# Patient Record
Sex: Female | Born: 1945 | Race: White | Hispanic: No | Marital: Married | Smoking: Never smoker
Health system: Southern US, Community
[De-identification: ages and names within clinical notes are randomized; demographics above are authoritative.]

## PROBLEM LIST (undated history)

## (undated) DIAGNOSIS — J45909 Unspecified asthma, uncomplicated: Secondary | ICD-10-CM

## (undated) DIAGNOSIS — Z87442 Personal history of urinary calculi: Secondary | ICD-10-CM

## (undated) DIAGNOSIS — Z8709 Personal history of other diseases of the respiratory system: Secondary | ICD-10-CM

## (undated) DIAGNOSIS — N3946 Mixed incontinence: Secondary | ICD-10-CM

## (undated) DIAGNOSIS — M47812 Spondylosis without myelopathy or radiculopathy, cervical region: Secondary | ICD-10-CM

## (undated) DIAGNOSIS — G8929 Other chronic pain: Secondary | ICD-10-CM

## (undated) DIAGNOSIS — R159 Full incontinence of feces: Secondary | ICD-10-CM

## (undated) DIAGNOSIS — Z973 Presence of spectacles and contact lenses: Secondary | ICD-10-CM

## (undated) DIAGNOSIS — G40309 Generalized idiopathic epilepsy and epileptic syndromes, not intractable, without status epilepticus: Secondary | ICD-10-CM

## (undated) HISTORY — DX: Unspecified asthma, uncomplicated: J45.909

## (undated) HISTORY — PX: TOE SURGERY: SHX1073

## (undated) HISTORY — PX: NECK SURGERY: SHX720

## (undated) HISTORY — PX: ELBOW SURGERY: SHX618

## (undated) HISTORY — PX: DILATION AND CURETTAGE OF UTERUS: SHX78

---

## 1998-10-16 ENCOUNTER — Ambulatory Visit (HOSPITAL_COMMUNITY): Admission: RE | Admit: 1998-10-16 | Discharge: 1998-10-16 | Payer: Self-pay | Admitting: Gynecology

## 1999-08-15 ENCOUNTER — Ambulatory Visit (HOSPITAL_COMMUNITY): Admission: RE | Admit: 1999-08-15 | Discharge: 1999-08-15 | Payer: Self-pay | Admitting: Orthopedic Surgery

## 1999-10-29 ENCOUNTER — Inpatient Hospital Stay (HOSPITAL_COMMUNITY): Admission: RE | Admit: 1999-10-29 | Discharge: 1999-10-30 | Payer: Self-pay | Admitting: Orthopaedic Surgery

## 1999-12-12 ENCOUNTER — Encounter: Payer: Self-pay | Admitting: Emergency Medicine

## 1999-12-12 ENCOUNTER — Emergency Department (HOSPITAL_COMMUNITY): Admission: EM | Admit: 1999-12-12 | Discharge: 1999-12-13 | Payer: Self-pay | Admitting: Emergency Medicine

## 2000-01-13 ENCOUNTER — Other Ambulatory Visit: Admission: RE | Admit: 2000-01-13 | Discharge: 2000-01-13 | Payer: Self-pay | Admitting: Gynecology

## 2001-01-15 ENCOUNTER — Other Ambulatory Visit: Admission: RE | Admit: 2001-01-15 | Discharge: 2001-01-15 | Payer: Self-pay | Admitting: Gynecology

## 2001-09-12 ENCOUNTER — Inpatient Hospital Stay (HOSPITAL_COMMUNITY): Admission: RE | Admit: 2001-09-12 | Discharge: 2001-09-13 | Payer: Self-pay | Admitting: Orthopaedic Surgery

## 2002-01-16 ENCOUNTER — Other Ambulatory Visit: Admission: RE | Admit: 2002-01-16 | Discharge: 2002-01-16 | Payer: Self-pay | Admitting: Gynecology

## 2002-03-18 ENCOUNTER — Other Ambulatory Visit: Admission: RE | Admit: 2002-03-18 | Discharge: 2002-03-18 | Payer: Self-pay | Admitting: Gynecology

## 2002-09-09 ENCOUNTER — Other Ambulatory Visit: Admission: RE | Admit: 2002-09-09 | Discharge: 2002-09-09 | Payer: Self-pay | Admitting: Gynecology

## 2003-03-11 ENCOUNTER — Other Ambulatory Visit: Admission: RE | Admit: 2003-03-11 | Discharge: 2003-03-11 | Payer: Self-pay | Admitting: Gynecology

## 2004-04-12 ENCOUNTER — Other Ambulatory Visit: Admission: RE | Admit: 2004-04-12 | Discharge: 2004-04-12 | Payer: Self-pay | Admitting: Gynecology

## 2005-05-23 ENCOUNTER — Other Ambulatory Visit: Admission: RE | Admit: 2005-05-23 | Discharge: 2005-05-23 | Payer: Self-pay | Admitting: Gynecology

## 2006-06-05 ENCOUNTER — Other Ambulatory Visit: Admission: RE | Admit: 2006-06-05 | Discharge: 2006-06-05 | Payer: Self-pay | Admitting: Gynecology

## 2007-06-18 ENCOUNTER — Other Ambulatory Visit: Admission: RE | Admit: 2007-06-18 | Discharge: 2007-06-18 | Payer: Self-pay | Admitting: Gynecology

## 2007-06-25 ENCOUNTER — Ambulatory Visit (HOSPITAL_COMMUNITY): Admission: RE | Admit: 2007-06-25 | Discharge: 2007-06-26 | Payer: Self-pay | Admitting: Orthopaedic Surgery

## 2008-06-16 ENCOUNTER — Ambulatory Visit (HOSPITAL_COMMUNITY): Admission: RE | Admit: 2008-06-16 | Discharge: 2008-06-17 | Payer: Self-pay | Admitting: Orthopaedic Surgery

## 2009-06-11 ENCOUNTER — Encounter: Admission: RE | Admit: 2009-06-11 | Discharge: 2009-06-11 | Payer: Self-pay | Admitting: Orthopaedic Surgery

## 2009-07-22 ENCOUNTER — Inpatient Hospital Stay (HOSPITAL_COMMUNITY): Admission: RE | Admit: 2009-07-22 | Discharge: 2009-07-23 | Payer: Self-pay | Admitting: Orthopaedic Surgery

## 2010-12-22 ENCOUNTER — Other Ambulatory Visit: Payer: Self-pay | Admitting: Anesthesiology

## 2010-12-22 DIAGNOSIS — M47812 Spondylosis without myelopathy or radiculopathy, cervical region: Secondary | ICD-10-CM

## 2010-12-28 ENCOUNTER — Ambulatory Visit
Admission: RE | Admit: 2010-12-28 | Discharge: 2010-12-28 | Disposition: A | Discharge status: Home / Self Care | Payer: 59 | Source: Ambulatory Visit | Attending: Anesthesiology | Admitting: Anesthesiology

## 2010-12-28 DIAGNOSIS — M47812 Spondylosis without myelopathy or radiculopathy, cervical region: Secondary | ICD-10-CM

## 2010-12-28 MED ORDER — IOHEXOL 300 MG/ML  SOLN
100.0000 mL | Freq: Once | INTRAMUSCULAR | Status: AC | PRN
  Administered 2010-12-28: 100 mL via INTRAVENOUS

## 2010-12-30 LAB — COMPREHENSIVE METABOLIC PANEL
Albumin: 4.2 g/dL (ref 3.5–5.2)
Alkaline Phosphatase: 66 U/L (ref 39–117)
BUN: 16 mg/dL (ref 6–23)
GFR calc Af Amer: >60 mL/min (ref >60)
Sodium: 139 mEq/L (ref 135–145)
Total Protein: 6.5 g/dL (ref 6.0–8.3)

## 2010-12-30 LAB — DIFFERENTIAL
Eosinophils Absolute: 0.1 10*3/uL (ref 0.0–0.7)
Eosinophils Relative: 2 % (ref 0–5)
Lymphocytes Relative: 35 % (ref 12–46)
Lymphs Abs: 1.7 10*3/uL (ref 0.7–4.0)

## 2010-12-30 LAB — CBC
HCT: 41.3 % (ref 36.0–46.0)
Hemoglobin: 14.4 g/dL (ref 12.0–15.0)
MCHC: 34.8 g/dL (ref 30.0–36.0)
MCV: 89.6 fL (ref 78.0–100.0)
Platelets: 258 10*3/uL (ref 150–400)
RBC: 4.61 MIL/uL (ref 3.87–5.11)

## 2011-02-08 NOTE — Op Note (Signed)
NAMEMARG, Carol [name]            ACCOUNT NO.:  [id number]   MEDICAL RECORD NO.:  [id number]          PATIENT TYPE:  AMB   LOCATION:  SDS                          FACILITY:  [facility]   PHYSICIAN:  Mark C. Ophelia Charter, M.D.    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   OPERATION/PROCEDURE:  C3-C4, C4-C5 anterior cervical diskectomy and  fusion, allograft and plating.  The external bone stimulator at  application.   SURGEON:  Mark C. Ophelia Charter, M.D.   ANESTHESIA:  General orotracheal.   ESTIMATED BLOOD LOSS:  Less than 100 mL.   DRAINS:  One Hemovac.   36 mm Biomet view lock plate,  16-XW locking screws.   DESCRIPTION OF PROCEDURE:  After induction of general anesthesia  orotracheal intubation and halter traction without weight, standard  prepping with DuraPrep and a yellow foam sponge placed underneath the  shoulder blades.  The neck was prepped with DuraPrep.  The area squared  with towels, Betadine and Vi-Drape applied after sterile skin marker on  a prominent skin fold.  The patient had a previous Cloward C5-6, C6-7  fusion which had healed solid.  And subperiosteal dissection on the  between the longus coli with carotid sheath and contents to the left  with left transverse incision starting at the midline.  Platysma was  split in line with the fibers and 25 short needle was placed at the 4-5  level.  Cross-table lateral C-arm confirmed this was appropriate level.  Since this was the level of the disk was some cord mild compression, 4-5  level was done first.  C4 level had primarily spondylitic hard and soft  disk combination.  Diskectomy was performed with the 15-blade.  Cloward  curets were used so  scraping out the gutters.  Operative microscope was  draped and brought in posterior longitudinal ligament was taking down  extruded disk fragment were removed and overhanging spurs.  Gutters were  stripped.  Sizing showed that a 7-mm graft was appropriate for this  level.  After take-down of posterior longitudinal ligament, there was  some epidural bleeding which was controlled with FloSeal and a padding.  Field was dry and a initial graft was placed 7 mm.  The upper level 6-mm  graft was appropriate and at this level there was primarily spurs with  overhanging from C3 over the disk space.  This had to be thinned down  with a 4-mm bur after endplates were curetted and then taken down with  microdissection with 1 mm Kerrison and Black nerve hook.  All bone was  removed.  Dura was visualized.  The vertebral joints were stripped and a  6-mm graft was placed.  Appropriate length plate was selected, checked  under fluoro, AP and lateral, with single prong plate holder spikes  holding the plate.  It was adjusted so that it was at  the midline  without angulation and then all six holes were hand drilled, screws  placed, 14 mm self tapping, and twisted down to the locked into the  ring.  Final AP and lateral x-rays were taken.  Good position of grafts,  plate, screws.  Operative field was dry.  Hemovac was placed through separate stab  incision with in-and-out technique of 3-0 Vicryl.  In the platysma, 4-0  Vicryl subcuticular.  The skin was then dermabonded and postop dressing  was applied with soft cervical collar.  The patient was neurologically  intact.      Mark C. Ophelia Charter, M.D.  Electronically Signed     MCY/MEDQ  D:  06/25/2007  T:  06/26/2007  Job:  [id number]

## 2011-02-08 NOTE — Op Note (Signed)
[name], Carol            ACCOUNT NO.:  [id number]   MEDICAL RECORD NO.:  [id number]          PATIENT TYPE:  OIB   LOCATION:  5010                         FACILITY:  [facility]   PHYSICIAN:  Mark C. Ophelia Charter, M.D.    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   PROCEDURE:  Posterior C4-5 fusion with wiring and Vitoss with iliac  crest aspirate.   SURGEON:  Mark C. Ophelia Charter, MD   ASSISTANT:  Wende Neighbors, PA-C   ANESTHESIA:  GOT plus Marcaine local.   DRAINS:  None.   A 65 year old female status post multiple cervical procedures and  pseudoarthrosis at C4-5 with persistent pain.  There was motion on  flexion/extension x-rays and lucency around the bone graft.   After induction of general anesthesia and orotracheal intubation, the  patient was placed prone on horseshoe head holder.  The neck was prepped  and squared with towels, arms tucked to the side with Gelfoam padding,  positioning prone position.  After a sterile skin marker, Betadine and  Biodrape application and thyroid sheet surgical checklist time-out was  completed, and preoperative Ancef was given prophylactically.  Old  incision was used.  Subcutaneous dissection down subperiosteally onto  the lamina was performed.  Expected level C4-5 was noted with bone at  the C5-6 level posteriorly and motion at C4-5 level.  A Kocher clamp was  placed through C4 and C5, and radiograph was taken which confirmed  appropriate level with a Kocher clamp between C4 and C5.  Decortication  over the lamina was performed.  A 24-gauge wire was twisted into strand,  passed around as it was being tightened down it would slip off the top  of C4 due to slope.  A small notch was made at the base of C4 with a  Matt Holmes rongeur.  A portion of the slippage was due to the new bone  formation along the lamina and  upsweep of the spinous process of C5 from  the patient's previous posterior cervical fusion, a more caudad level.  As the wires are being retightened down spinous process cracked at C4.  Wire was cut, removed and a 22-gauge wire was then selected and some  holes were drilled in the base of the spinous process and lamina.  Wire  was passed through each hole which was unicortical on the lamina at the  base of the spinous process several rounds underneath C5 and then  twisted down.  This held securely and Vitoss was then prepared using  left posterior iliac crest aspirate through a stab incision.  Vitoss was  split evenly into two areas which was a 10 mL strip and packed down onto  the decorticated lamina.  Operative field was dry.  The deep layer  closed with 0 Vicryl, 2-0 Vicryl subcutaneous tissue, 4-0 Vicryl  subcuticular closure, postop dressing, soft cervical collar.  Instrument  count and needle count was correct.      Mark C. Ophelia Charter, M.D.  Electronically  Signed     MCY/MEDQ  D:  06/17/2008  T:  06/17/2008  Job:  [id number]

## 2011-02-11 NOTE — Op Note (Signed)
[place]  Patient:    Carol [name]                    MRN: [id number] Proc. Date: 10/29/99 Adm. Date:  [id number] Disc. Date: [id number] Attending:  Jacki Cones                           Operative Report  PREOPERATIVE DIAGNOSIS:  Painful pseudarthrosis.  POSTOPERATIVE DIAGNOSIS:  Painful pseudarthrosis.  OPERATION PERFORMED:  C6-7 takedown pseudoarthrosis and Cloward fusion, right iliac crest bone graft.  SURGEON:  Mark C. Ophelia Charter, M.D.  ANESTHESIA:  General.  ESTIMATED BLOOD LOSS:  200 cc.  DRAINS:  One Hemovac in neck.  DESCRIPTION OF PROCEDURE:  This patient had a previous fusion by Dr. Kathrene Bongo about one year ago and has had neck and right arm pain for several months. Repeat MRI showes a pseudarthrosis at C6-7 and flexion extension x-ray showed motion at C6-7.  C5-6 had healed completely.  Standard Dura-Prep, preoperative Ancef was used.  Both areas were squared out with towels, the right iliac crest and neck and a 2-pound sandbag behind the neck and gelbag behind the right buttock.  Betadine Vi-Drapes were applied after the old  skin was marked with a skin marker.  Sterile Mayo stand at the head, thyroid sheets and drapes.  Old incision was opened and scar tissue was gently lysed. Platysma elevated.  Sternocleidomastoid retracted laterally.  Omohyoid was gently retracted, not divided and scar tissue was divided directly over the anterior spur between the longus coli.  After I removed some of the spur with a rongeur, a 25 needle was stuck into the  pseudarthrosis site and a crosstable lateral x-ray confirmed it was at the C6-7  level.  Using a 12 mm Cloward drill, sequentially, the hole was drilled through the hard sclerotic bone.  The pseudarthrosis followed an irregular line as expected and as the sequential drilling progressed using Carlens curets and Cloward curets, y the time the posterior cortex was  encountered, the pseudarthrosis line was directly in the middle of the drill hole.  Operative microscope was prepped with a sterile drape and brought in and posterior longitudinal ligament was taken down on the right side.  Spurs were removed.  There was some soft tissue and fibrous tissue in the gutter but no residual disk material was present.  Gutters were meticulously stripped of soft tissue and fibrous tissue to give good bleeding bone present.  This was repeated using Carlens and Cloward curets from the opposite site as well, both right and left gutters.  There was a little fibrous pocket in the Cloward drill hole at the 4 to 5 oclock position deep in the posterior aspect of the C7  body.  After irrigation with saline solution and reinspection for any remaining  fibrous tissue.  The 14 mm plug was harvested from the right iliac crest by splitting the fascia in line with its fibers and harvesting the 14 plug.  There was a good cortical plug exactly perpendicular to the bicortical surface.  Depth gauge was used.  Plug was inserted, countersunk 2 to 3 mm with an extremely tight fit. Due to the excellent fit and minimal motion at the pseudarthrosis site, it was elected not to cortically plate the structure.  After irrigation with saline solution, a Hemovac drain was placed through a separate stab incision. Platysma was closed followed by skin closure with  4-0 Vicryl suture.  The iliac crest was closed with 0 Vicryl in the fascia, 2-0 in the subcutaneous tissue, 3-0 Vicryl subcutaneous and 4-0 Vicryl subcuticular.  Dressings were applied to both incisions, soft collar and the patient was transferred to the recovery room in stable condition.  Instrument count and needle count was correct. DD:  10/29/99 TD:  10/30/99 Job: [id number]

## 2011-02-11 NOTE — Op Note (Signed)
. [facility]  Patient:    Carol [name], Carol [name] Visit Number: [id number] MRN: [id number]          Service Type: SUR Location: [phone] Attending Physician:  Jacki Cones Dictated by:   Veverly Fells Ophelia Charter, M.D. Proc. 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  PROCEDURE:  Posterior C6-7 fusion, interspinous wiring with right posterior iliac crest bone graft.  SURGEON:  Mark C. Ophelia Charter, M.D.  FIRST ASSISTANT:  Colon Flattery. Ollen Bowl, M.D.  SECOND ASSISTANT:  Zonia Kief, P.A.-C.  ANESTHESIA:  GOT.  ESTIMATED BLOOD LOSS:  400 ml.  DESCRIPTION OF PROCEDURE:  After the induction of general anesthesia and orotracheal intubation, the patient was placed in a prone position with careful positioning by myself and Dr. Ollen Bowl.  Arms were tucked at the sides, the head was placed in the horseshoe head holder with excessive Webril padding.  Appropriate neutral position of the neck was maintained.  _____ drapes were placed with tincture of benzoin.  The area was prepped with Duraprep, preoperative Ancef was given prophylactically.  A sterile skin marker was used on the neck prior to Betadine Vi-Drape application, marking the C7 spinous process in the midline.  Sterile Mayo stand was placed at the head, thyroid sheet and the usual half-sheet in between.  The incision was made starting in the midline, extending from C4 to C7, the tip of the spinous processes.  Subperiosteal dissection down onto the lamina was performed. Self-retaining retractors were placed using Gelpis, long Gelpis, and a cerebellar retractor.  The bed was placed in reverse Trendelenburg to aid in visualization.  Small bleeders were controlled with the Bovie electrocautery.  A Kocher clamp was placed on C7, and there was trace motion between  C6-7. C5-6 was solid with no motion with the lamina, and the spinous process was fully exposed.  There was gapping of the interspinous space and interlaminar space at C6-7 consistent with pseudoarthrosis.  A bone bur was used for partial decortication, enough to just get the bone surface bleeding. Irrigation was used intermittently throughout the case.  Sponges were placed, and the right iliac crest was exposed, taking corticocancellous strips followed by abundant cancellous graft using a large curette.  This was cut into small strips, divided into pieces, half and half for each side. Corticocancellous strips were initially placed vertically from cephalad to caudad across the lamina.  Pieces of cancellous bone, small, were placed between the inferior aspect of the lamina of C6 and top of C7 without protruding into the canal.  Small pieces were placed underneath the interspinous ligament, which was saved.  Abundant chunks of cancellous bone were placed over the top of the corticocancellous strips.  Twenty-four gauge wire was then braided using the drill into a twisting fashion, making a cable. Once it was smoothly twisted, the tip of it was bent and it was passed using a right angle clamp underneath the base of C7 and then around the top of a small notch placed at C6.  The C6 spinous process was adequate in size and allowed good securement of the wire.  The wire was twisted on the right side, cut, and then bent.  There was no prominence of the wire, and bone graft  was meticulously placed from C6-7 and after removal of the self-retaining retractors with care taken not to move the bone graft, the entire incision was irrigated and then sequentially closed with 0 Vicryl in the deep fascia, 0 Vicryl in the superficial fascia, 2-0 Vicryl in the subcutaneous tissue, and a 4-0 Vicryl subcuticular skin closure.  Tincture of benzoin and Steri-Strips, Marcaine infiltration was then placed.  Hemovac was  placed in the neck prior to closure, exiting through a stab incision made with an in-out technique in line with the skin incision inferiorly.  A second Hemovac drain was placed in the hip, and the iliac crest was closed with 0 Vicryl in the fascia, 2-0 Vicryl in the subcutaneous tissue, Marcaine infiltration, skin staple closure, and a postop dressing.  Instrument count and needle count were correct.  The patient was transferred to the recovery room in stable condition. Dictated by:   Veverly Fells Ophelia Charter, M.D. Attending Physician:  Jacki Cones DD:  09/12/01 TD:  09/13/01 Job: [id number]

## 2011-06-27 LAB — CBC
MCV: 88.4
RBC: 4.74
RDW: 11.8
WBC: 5.2

## 2011-06-27 LAB — COMPREHENSIVE METABOLIC PANEL
ALT: 21
AST: 22
Alkaline Phosphatase: 57
BUN: 15
CO2: 27
Calcium: 9.8
GFR calc Af Amer: >60
Potassium: 4.7
Total Protein: 6.2

## 2011-06-27 LAB — DIFFERENTIAL
Basophils Absolute: 0
Eosinophils Relative: 2
Lymphs Abs: 1.7
Neutro Abs: 3
Neutrophils Relative %: 58

## 2011-06-27 LAB — APTT: aPTT: 30

## 2011-07-07 LAB — BASIC METABOLIC PANEL
BUN: 17
Calcium: 10.1
Creatinine, Ser: 1.03
GFR calc Af Amer: >60
Potassium: 5

## 2011-07-07 LAB — DIFFERENTIAL
Basophils Absolute: 0
Basophils Relative: 1
Eosinophils Absolute: 0.1
Eosinophils Relative: 2
Lymphs Abs: 1.7
Neutrophils Relative %: 56

## 2011-07-07 LAB — CBC
HCT: 44.5
Hemoglobin: 15.5 — ABNORMAL HIGH
MCHC: 34.7
MCV: 86.9
Platelets: 297
RBC: 5.12 — ABNORMAL HIGH
RDW: 11.8
WBC: 5

## 2011-07-07 LAB — URINE MICROSCOPIC-ADD ON

## 2011-07-07 LAB — URINALYSIS, ROUTINE W REFLEX MICROSCOPIC
Hgb urine dipstick: NEGATIVE
Protein, ur: NEGATIVE
Specific Gravity, Urine: 1.02

## 2011-07-07 LAB — APTT: aPTT: 31

## 2011-08-08 IMAGING — CT CT CERVICAL SPINE W/O CM
3 of 4 series · 16 of 28 positions shown, 18 images · non-contrast
Comparison: Intraoperative film 06/16/2008.

CLINICAL DATA: Status post cervical spine fusion at C4-5.
Persistent right shoulder pain.  Question pseudarthrosis.

CT CERVICAL SPINE WITHOUT CONTRAST
TECHNIQUE: Multidetector CT imaging of the cervical spine was
performed. Multiplanar CT image reconstructions were also
generated.

[Series 2: cervical spine · axial · 0.23mm/px · z∈[+95,+211]mm · 5 of 139 slices shown, 7 images]
[im 24/139  soft-tissue]
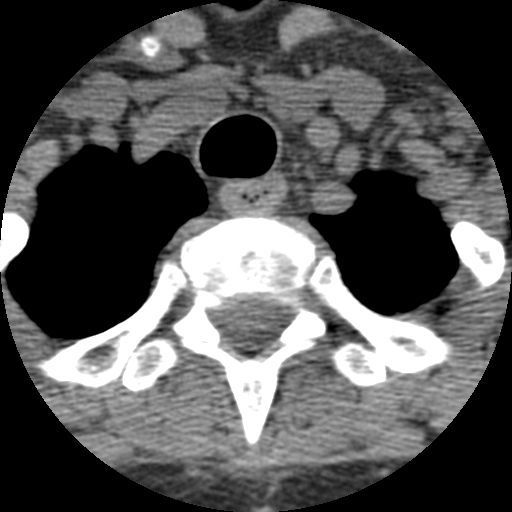
[im 24/139  bone]
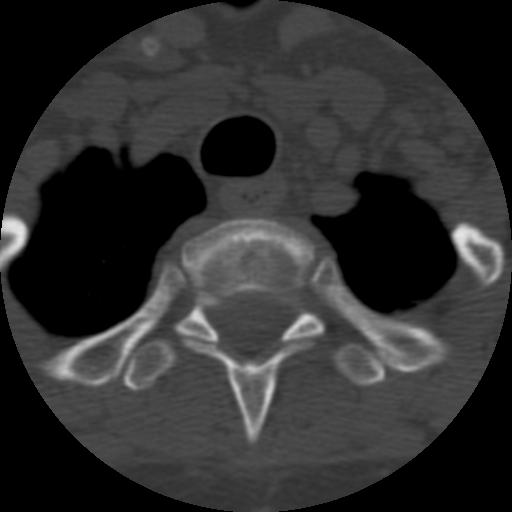
[im 47/139  bone]
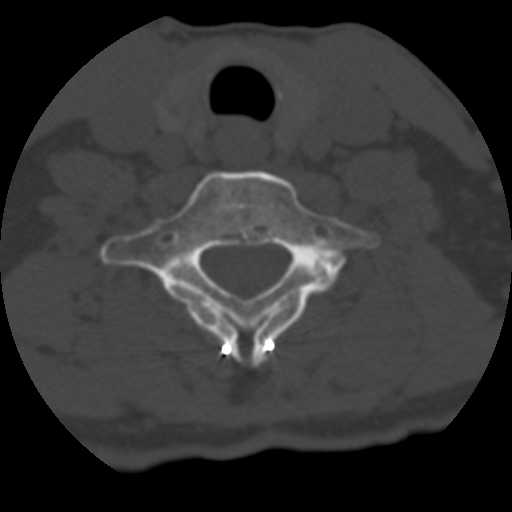
[im 70/139  bone]
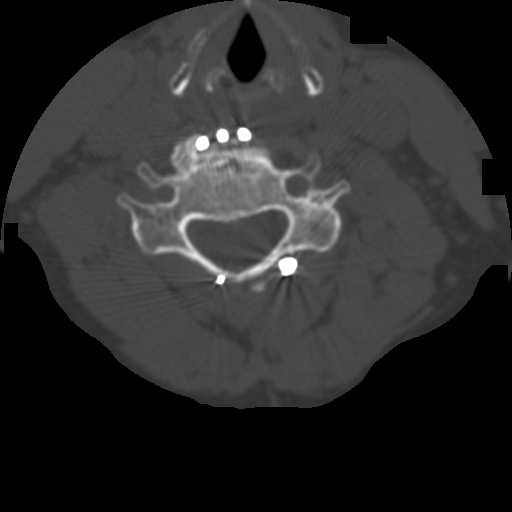
[im 93/139  bone]
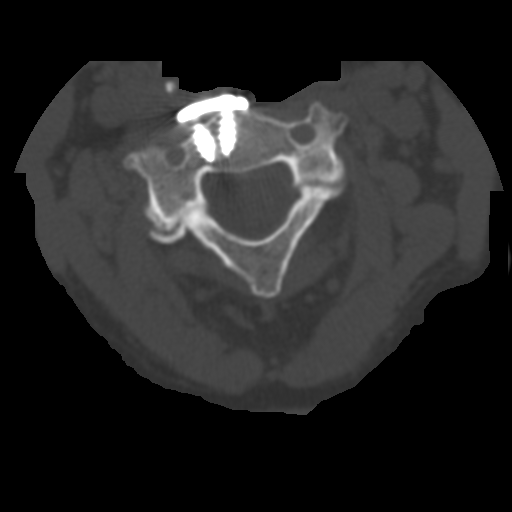
[im 116/139  soft-tissue]
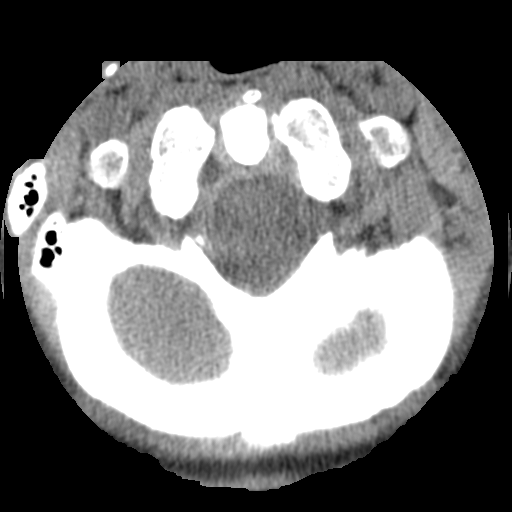
[im 116/139  bone]
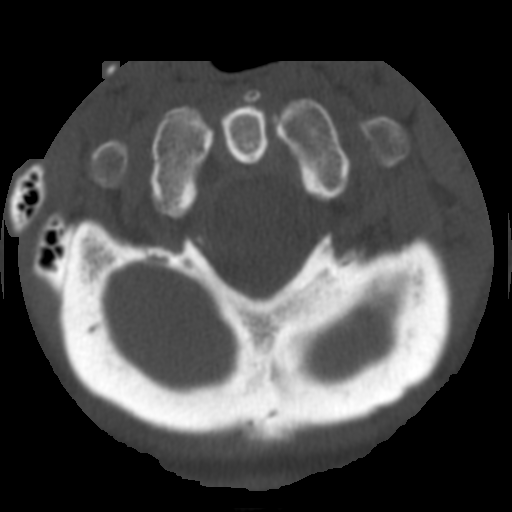

[Series 3: bone windows · axial · 0.23mm/px · z∈[+95,+210]mm · 5 of 140 slices shown]
[im 24/140  bone]
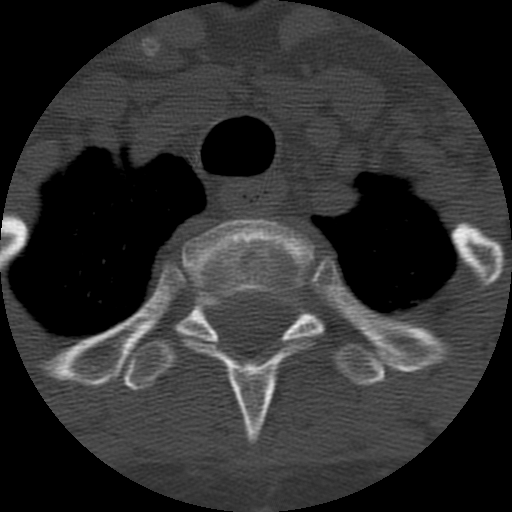
[im 47/140  bone]
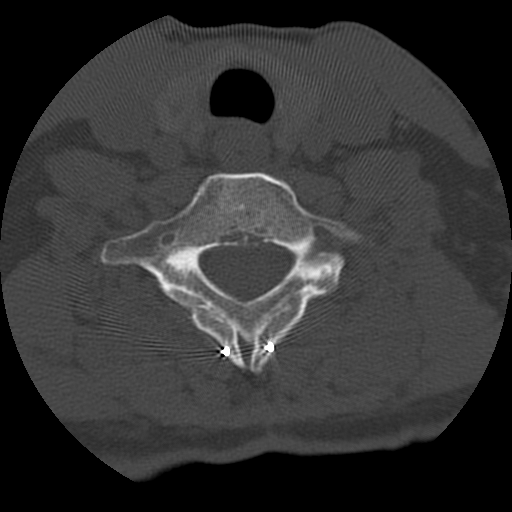
[im 70/140  bone]
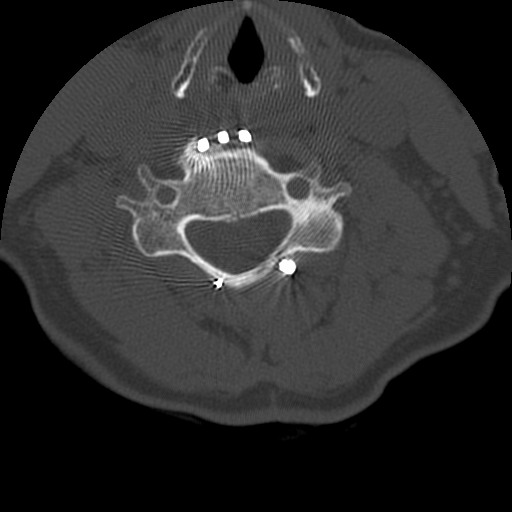
[im 93/140  bone]
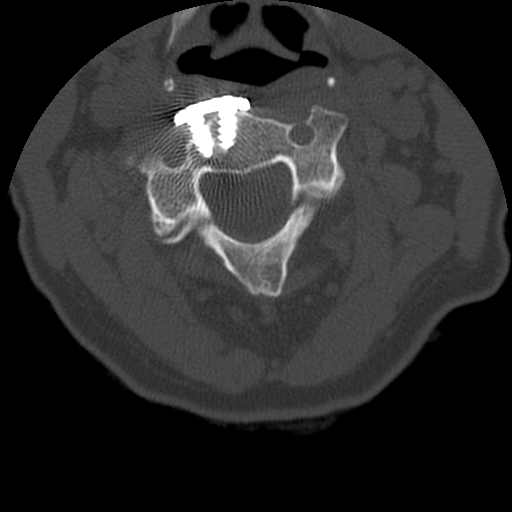
[im 116/140  bone]
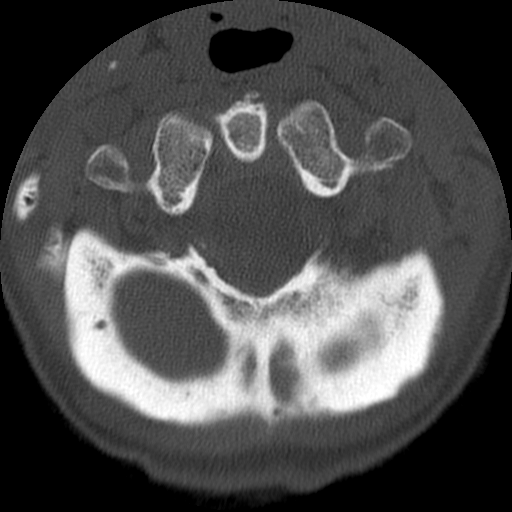

[Series 400: coronal · coronal · 0.35mm/px · 6 of 40 slices shown]
[im 5/40  soft-tissue]
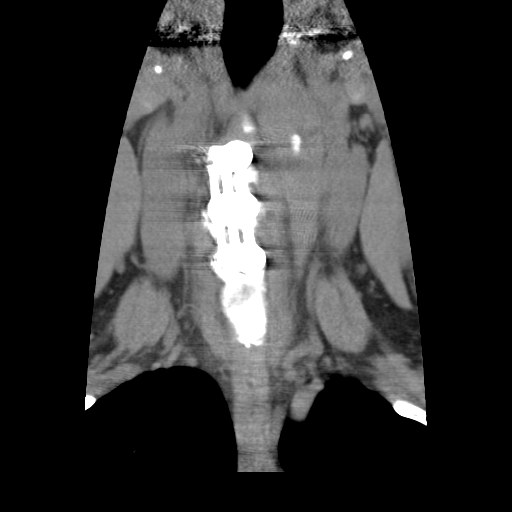
[im 7/40  bone]
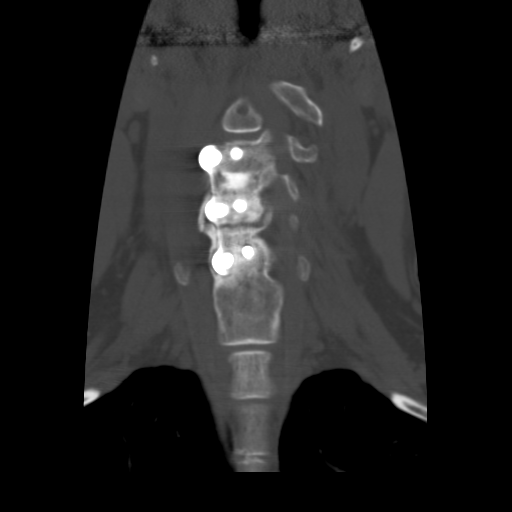
[im 14/40  bone]
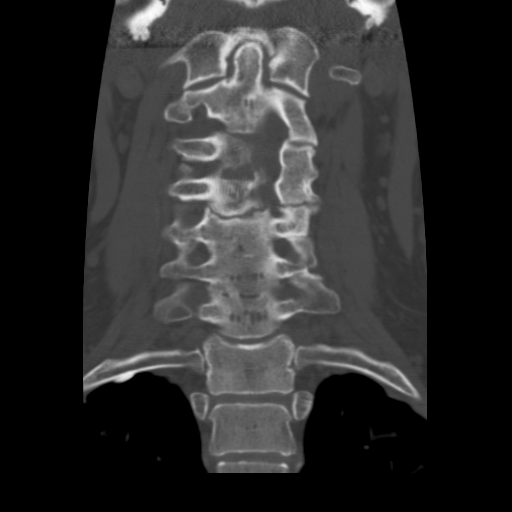
[im 20/40  bone]
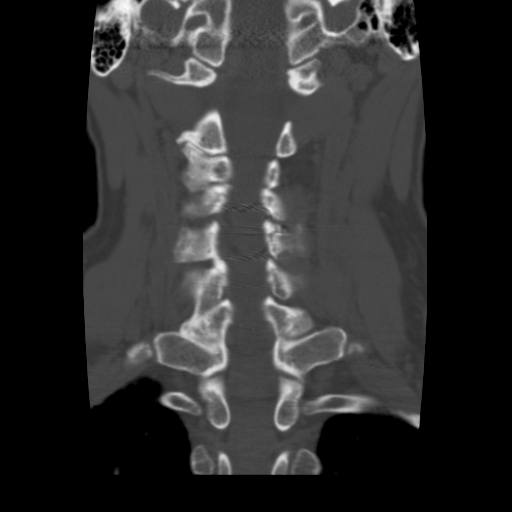
[im 27/40  bone]
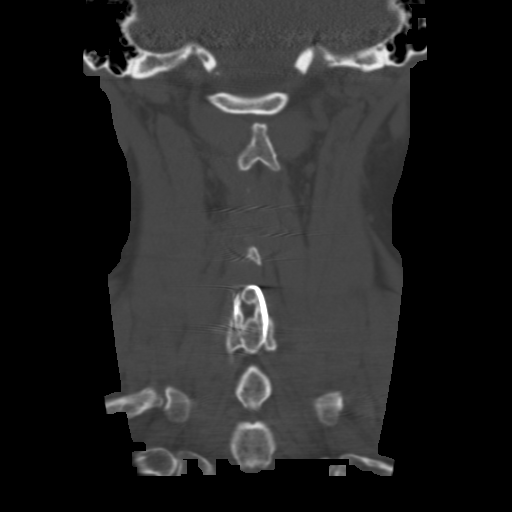
[im 33/40  bone]
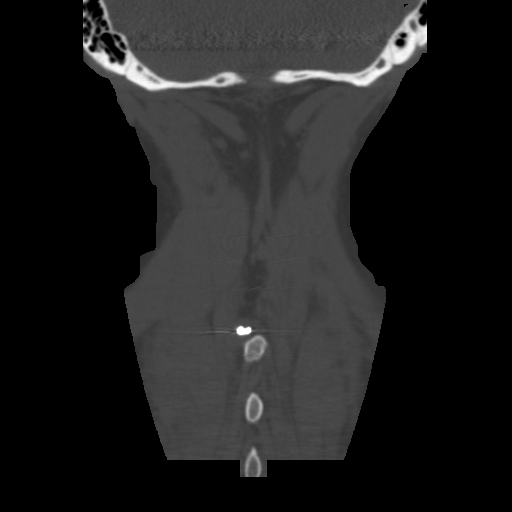

[16 of 28 positions shown; findings below may reference images not displayed]

FINDINGS: There is mature osseous fusion at C5-6 and C6-7.
Anterior plate screw fixation is present at C3-4 and C4-5.  There
is bridging bone at the C3-4 level.  There is lucency surrounding
the disc space air at the C4-5.  There is incomplete column of bone
within the spacer.  The left inferior screw at C5 is fractured.
There is some lucency about the proximal aspect of the screw and
the anterior plate.  The right-sided screw appears well seated.
There is slight anterolisthesis at C4-5.  Alignment is otherwise
anatomic.  Posterior cerclage wires are present at C4-5.  There is
also a posterior cerclage wire at C6-7 with mature osseous fusion
of the spinous processes.  Individual disc levels are as follows.

C2-3:  Asymmetric right-sided facet hypertrophy is present.  There
is no significant stenosis.

C3-4:  Mild uncovertebral disease is present.  There is asymmetric
left-sided facet hypertrophy.  Mild left foraminal narrowing is
noted.

C4-5:  There is uncovertebral disease bilaterally.  Asymmetric left-
sided facet hypertrophy is noted.  There is mild left foraminal
narrowing.  As stated, there is persistent lucency around the disc
space without clear bridging bone.

C5-6:  The patient is fused at this level.  There is no focal
stenosis.

C6-7:  The patient is fused.  There is mild osseous foraminal
narrowing bilaterally.  The central canal appears patent.

C7-C1:  Mild facet hypertrophy is present on the right.  There is
no significant stenosis.
IMPRESSION: 1.  There is only a single column of attempted bridging bone inside
the spacer at C4-5.  There is lucency through this column.  There
is persistent lucency surrounding the disc space are otherwise ,
suggesting nonunion.
2.  There is a fracture of the left C5 screw with some lucency
surrounding the proximal portion of the screw and the anterior
plate at the C5 level on the left.

3.  Mature osseous fusion at C5-6 and C6-7.
4.  Bridging bone and osseous fusion at C3-4.
5.  Mild residual spondylosis as described.

## 2012-10-15 ENCOUNTER — Other Ambulatory Visit: Payer: Self-pay | Admitting: Gynecology

## 2012-10-15 DIAGNOSIS — M949 Disorder of cartilage, unspecified: Secondary | ICD-10-CM

## 2012-10-15 DIAGNOSIS — Z1231 Encounter for screening mammogram for malignant neoplasm of breast: Secondary | ICD-10-CM

## 2012-10-15 DIAGNOSIS — M899 Disorder of bone, unspecified: Secondary | ICD-10-CM

## 2012-11-12 ENCOUNTER — Ambulatory Visit
Admission: RE | Admit: 2012-11-12 | Discharge: 2012-11-12 | Disposition: A | Discharge status: Home / Self Care | Payer: BC Managed Care – PPO | Source: Ambulatory Visit | Attending: Gynecology | Admitting: Gynecology

## 2012-11-12 DIAGNOSIS — Z1231 Encounter for screening mammogram for malignant neoplasm of breast: Secondary | ICD-10-CM

## 2012-11-12 DIAGNOSIS — M899 Disorder of bone, unspecified: Secondary | ICD-10-CM

## 2014-03-17 LAB — HM COLONOSCOPY

## 2017-04-20 LAB — VITAMIN D 25 HYDROXY (VIT D DEFICIENCY, FRACTURES): VIT D 25 HYDROXY: 27.5

## 2017-04-20 LAB — LIPID PANEL
CHOLESTEROL: 176 (ref 0–200)
HDL: 61 (ref 35–70)
LDL CALC: 97
TRIGLYCERIDES: 88 (ref 40–160)

## 2017-04-20 LAB — BASIC METABOLIC PANEL
BUN: 16 (ref 4–21)
Creatinine: 1.1 (ref 0.5–1.1)

## 2017-04-20 LAB — TSH: TSH: 3.78 (ref 0.41–5.90)

## 2017-05-16 ENCOUNTER — Encounter: Payer: Self-pay | Admitting: Physician Assistant

## 2017-05-16 ENCOUNTER — Ambulatory Visit (INDEPENDENT_AMBULATORY_CARE_PROVIDER_SITE_OTHER): Payer: BLUE CROSS/BLUE SHIELD | Admitting: Physician Assistant

## 2017-05-16 VITALS — BP 138/70 | HR 98 | Temp 98.3°F | Ht 62.0 in | Wt 152.0 lb

## 2017-05-16 DIAGNOSIS — R32 Unspecified urinary incontinence: Secondary | ICD-10-CM | POA: Diagnosis not present

## 2017-05-16 DIAGNOSIS — R790 Abnormal level of blood mineral: Secondary | ICD-10-CM

## 2017-05-16 DIAGNOSIS — Z23 Encounter for immunization: Secondary | ICD-10-CM

## 2017-05-16 DIAGNOSIS — R079 Chest pain, unspecified: Secondary | ICD-10-CM | POA: Diagnosis not present

## 2017-05-16 DIAGNOSIS — R197 Diarrhea, unspecified: Secondary | ICD-10-CM

## 2017-05-16 LAB — CBC WITH DIFFERENTIAL/PLATELET
BASOS ABS: 0 10*3/uL (ref 0.0–0.1)
Basophils Relative: 0.6 % (ref 0.0–3.0)
EOS ABS: 0.1 10*3/uL (ref 0.0–0.7)
Eosinophils Relative: 1.4 % (ref 0.0–5.0)
HEMATOCRIT: 44.9 % (ref 36.0–46.0)
HEMOGLOBIN: 15.3 g/dL — AB (ref 12.0–15.0)
LYMPHS PCT: 31.3 % (ref 12.0–46.0)
Lymphs Abs: 2.1 10*3/uL (ref 0.7–4.0)
MCHC: 34 g/dL (ref 30.0–36.0)
MCV: 89.7 fl (ref 78.0–100.0)
MONO ABS: 0.4 10*3/uL (ref 0.1–1.0)
Monocytes Relative: 6.5 % (ref 3.0–12.0)
Neutro Abs: 4 10*3/uL (ref 1.4–7.7)
Neutrophils Relative %: 60.2 % (ref 43.0–77.0)
Platelets: 311 10*3/uL (ref 150.0–400.0)
RBC: 5.01 Mil/uL (ref 3.87–5.11)
RDW: 13 % (ref 11.5–15.5)
WBC: 6.7 10*3/uL (ref 4.0–10.5)

## 2017-05-16 LAB — IBC PANEL
Iron: 88 ug/dL (ref 42–145)
SATURATION RATIOS: 19.5 % — AB (ref 20.0–50.0)
TRANSFERRIN: 322 mg/dL (ref 212.0–360.0)

## 2017-05-16 LAB — TSH: TSH: 2.01 u[IU]/mL (ref 0.35–4.50)

## 2017-05-16 LAB — COMPREHENSIVE METABOLIC PANEL
ALT: 20 U/L (ref 0–35)
AST: 18 U/L (ref 0–37)
Albumin: 4.2 g/dL (ref 3.5–5.2)
Alkaline Phosphatase: 79 U/L (ref 39–117)
BUN: 19 mg/dL (ref 6–23)
CALCIUM: 9.6 mg/dL (ref 8.4–10.5)
CHLORIDE: 106 meq/L (ref 96–112)
CO2: 31 meq/L (ref 19–32)
CREATININE: 0.97 mg/dL (ref 0.40–1.20)
GFR: 60.51 mL/min (ref >60.00)
Glucose, Bld: 97 mg/dL (ref 70–99)
POTASSIUM: 4.2 meq/L (ref 3.5–5.1)
Sodium: 142 mEq/L (ref 135–145)
Total Bilirubin: 0.4 mg/dL (ref 0.2–1.2)
Total Protein: 7.4 g/dL (ref 6.0–8.3)

## 2017-05-16 LAB — FERRITIN: Ferritin: 50 ng/mL (ref 10.0–291.0)

## 2017-05-16 NOTE — Patient Instructions (Addendum)
It was great to see you!  For your diarrhea -- please work on increasing the fiber in your diet and staying hydrated. I would like for you to start a probiotic, this can be purchased over the counter. Consider Align or Digestive Advantage. Please return stool samples. If symptoms worsen in the meantime, please let us know.  For your chest pain -- I have referred you to cardiology. They can evaluate your risk factors and determine if you need further work-up. You will be contacted about this. If your pain returns and is atypical of your prior events --> please go to the ER.  We will contact you with your lab results.  Let's follow-up at your convenience, within a month to talk about your diarrhea and urinary incontinence, sooner if needed.   High-Fiber Diet Fiber, also called dietary fiber, is a type of carbohydrate found in fruits, vegetables, whole grains, and beans. A high-fiber diet can have many health benefits. Your health care provider may recommend a high-fiber diet to help:  Prevent constipation. Fiber can make your bowel movements more regular.  Lower your cholesterol.  Relieve hemorrhoids, uncomplicated diverticulosis, or irritable bowel syndrome.  Prevent overeating as part of a weight-loss plan.  Prevent heart disease, type 2 diabetes, and certain cancers.  What is my plan? The recommended daily intake of fiber includes:  38 grams for men under age 57.  72 grams for men over age 66.  97 grams for women under age 74.  73 grams for women over age 107.  You can get the recommended daily intake of dietary fiber by eating a variety of fruits, vegetables, grains, and beans. Your health care provider may also recommend a fiber supplement if it is not possible to get enough fiber through your diet. What do I need to know about a high-fiber diet?  Fiber supplements have not been widely studied for their effectiveness, so it is better to get fiber through food  sources.  Always check the fiber content on thenutrition facts label of any prepackaged food. Look for foods that contain at least 5 grams of fiber per serving.  Ask your dietitian if you have questions about specific foods that are related to your condition, especially if those foods are not listed in the following section.  Increase your daily fiber consumption gradually. Increasing your intake of dietary fiber too quickly may cause bloating, cramping, or gas.  Drink plenty of water. Water helps you to digest fiber. What foods can I eat? Grains Whole-grain breads. Multigrain cereal. Oats and oatmeal. Brown rice. Barley. Bulgur wheat. [place]. Bran muffins. Popcorn. Rye wafer crackers. Vegetables Sweet potatoes. Spinach. Kale. Artichokes. Cabbage. Broccoli. Green peas. Carrots. Squash. Fruits Berries. Pears. Apples. Oranges. Avocados. Prunes and raisins. Dried figs. Meats and Other Protein Sources [place], kidney, pinto, and soy beans. Split peas. Lentils. Nuts and seeds. Dairy Fiber-fortified yogurt. Beverages Fiber-fortified soy milk. Fiber-fortified orange juice. Other Fiber bars. The items listed above may not be a complete list of recommended foods or beverages. Contact your dietitian for more options. What foods are not recommended? Grains White bread. Pasta made with refined flour. White rice. Vegetables Fried potatoes. Canned vegetables. Well-cooked vegetables. Fruits Fruit juice. Cooked, strained fruit. Meats and Other Protein Sources Fatty cuts of meat. Fried Sales executive or fried fish. Dairy Milk. Yogurt. Cream cheese. Sour cream. Beverages Soft drinks. Other Cakes and pastries. Butter and oils. The items listed above may not be a complete list of foods and beverages to avoid. Contact  your dietitian for more information. What are some tips for including high-fiber foods in my diet?  Eat a wide variety of high-fiber foods.  Make sure that half of all grains consumed  each day are whole grains.  Replace breads and cereals made from refined flour or white flour with whole-grain breads and cereals.  Replace white rice with brown rice, bulgur wheat, or millet.  Start the day with a breakfast that is high in fiber, such as a cereal that contains at least 5 grams of fiber per serving.  Use beans in place of meat in soups, salads, or pasta.  Eat high-fiber snacks, such as berries, raw vegetables, nuts, or popcorn. This information is not intended to replace advice given to you by your health care provider. Make sure you discuss any questions you have with your health care provider. Document Released: 09/12/2005 Document Revised: 02/18/2016 Document Reviewed: 02/25/2014 Elsevier Interactive Patient Education  2017 [employer].

## 2017-05-16 NOTE — Progress Notes (Signed)
Carol [name] is a 71 y.o. female here for a new problem.  History of Present Illness:   Chief Complaint  Patient presents with  . Establish Care  . Labs Only  . Chest Pain  . Diarrhea    X46months     Acute Concerns: Chest pain -- mid-sternal chest pain x 4 times over the past 2 months. Radiates to bilateral jaw. Lasts about 5-10 min. Happens at rest.  "Hurts like heck" -- unable to tell me if its sharp, shooting, dull. Pressing the area does not reproduce the pain. No hx of heartburn or reflux. Started 2 months ago. Unable to recall if worsened with deep inspiration. No SOB, changes in vision, or unusual HA, no swelling in lower legs. No exertional symptoms. Last occurred 1-2 weeks ago. Does not consider herself an anxious person. Very little caffeine. No prior hx of cardiology issues.  Diarrhea -- she has been experiencing this for 6 months. Normally goes to the bathroom every 2-3 days. Now is having loose stools, will have 3-4 in a day.. No dietary changes in the past 6 months. Back in Feb went to [place] (for a total of 9 days) and since that trip has had intermittent loose stools, did have flu-like symptoms while on her trip. 4 years ago had a colonoscopy and was cleared for 10 years to return. No family hx of IBD, IBS, colon cancer. No changes in weight.  No bloody stools. No hemorrhoids. Has been drinking plenty of water. Eats wheat bread, vegetables. No fiber supplements or probiotics. Limited fruit in diet.   Incontinence -- has urinary urgency over the last 6-9 months. Has to wear a pad or panty liner. Has had 2 vaginal births. Has not had prior work up for this before.  Abnormal labs -- at the end of July she got some labs done for her husband's insurance, shows elevated Cre of 1.08, elevated iron of 165 and decreased GFR of 53. She would like repeat labs today to further evaluate for this.  Health Maintenance: Immunizations -- needs flu, PNA, Tdap Colonoscopy -- performed 4  years ago, records pending Weight -- Weight: 152 lb (68.9 kg)   Depression screen [facility] 2/9 05/16/2017  Decreased Interest 0  Down, Depressed, Hopeless 0  PHQ - 2 Score 0    No flowsheet data found.  Other providers/specialists: Dr. Hardin Negus -- pain management   Past Medical History:  Diagnosis Date  . Asthma      Social History   Social History  . Marital status: Married    Spouse name: N/A  . Number of children: N/A  . Years of education: N/A   Occupational History  . Not on file.   Social History Main Topics  . Smoking status: Never Smoker  . Smokeless tobacco: Never Used  . Alcohol use No  . Drug use: No  . Sexual activity: No   Other Topics Concern  . 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  Family History  Problem Relation Age of Onset  . COPD Mother   . COPD Father     No Known Allergies   Current Medications:   Current Outpatient Prescriptions:  Marland Kitchen  GABAPENTIN PO, Take by mouth., Disp: ,  Rfl:  .  Influenza vac split quadrivalent PF (FLUZONE HIGH-DOSE) 0.5 ML injection, Fluzone High-Dose 2017-2018 (PF) 180 mcg/0.5 mL intramuscular syringe  TO BE ADMINISTERED BY PHARMACIST FOR IMMUNIZATION, Disp: , Rfl:  .  pneumococcal 13-valent conjugate vaccine (PREVNAR 13) SUSP injection, Prevnar 13 (PF) 0.5 mL intramuscular syringe  TO BE ADMINISTERED BY PHARMACIST FOR IMMUNIZATION, Disp: , Rfl:    Review of Systems:   Review of Systems  Constitutional: Negative for chills, fever, malaise/fatigue and weight loss.  Respiratory: Negative for cough, hemoptysis, sputum production and shortness of breath.   Cardiovascular: Positive for chest pain. Negative for palpitations, claudication and leg swelling.  Gastrointestinal: Positive for diarrhea. Negative for abdominal pain, constipation,  heartburn, nausea and vomiting.  Genitourinary: Positive for frequency and urgency. Negative for dysuria.  Musculoskeletal: Negative for myalgias.  Neurological: Negative for dizziness, tingling, tremors and headaches.  Psychiatric/Behavioral: Negative for depression. The patient is not nervous/anxious.      Vitals:   Vitals:   05/16/17 1312  BP: 138/70  Pulse: 98  Temp: 98.3 F (36.8 C)  TempSrc: Oral  SpO2: 97%  Weight: 152 lb (68.9 kg)     There is no height or weight on file to calculate BMI.  Physical Exam:   Physical Exam  Constitutional: She appears well-developed. She is cooperative.  Non-toxic appearance. She does not have a sickly appearance. She does not appear ill. No distress.  Cardiovascular: Normal rate, regular rhythm, S1 normal, S2 normal, normal heart sounds and normal pulses.   No LE edema  Pulmonary/Chest: Effort normal and breath sounds normal.  Abdominal: Normal appearance and bowel sounds are normal. There is no tenderness. There is no rigidity, no rebound, no guarding, no tenderness at McBurney's point and negative Murphy's sign.  Declines rectal exam  Neurological: She is alert. GCS eye subscore is 4. GCS verbal subscore is 5. GCS motor subscore is 6.  Skin: Skin is warm, dry and intact.  Psychiatric: She has a normal mood and affect. Her speech is normal and behavior is normal.  Nursing note and vitals reviewed.  EKG tracing is personally reviewed.  EKG notes NSR.  No acute changes.   Assessment and Plan:    Tawna was seen today for establish care, labs only, chest pain and diarrhea.  Diagnoses and all orders for this visit:  Chest pain, unspecified type EKG tracing is personally reviewed.  EKG notes NSR.  No acute changes. No prior EKG available for comparison. Will obtain labs for further evaluation. No exertional symptoms. Given age, low threshold to send to ER for recurring symptoms or changes. She verbalizes understanding. Will refer to  cardiology for further work-up and consideration of stress test.  -     EKG 12-Lead -     Comprehensive metabolic panel -     CBC with Differential/Platelet -     TSH -     Ambulatory referral to Cardiology  Diarrhea, unspecified type Stool studies pending. No red flags in hx or exam. Discussed initiation of probiotic and consumption of high fiber foods. Push fluids. Follow-up in 1 month. -     Clostridium Difficile by PCR; Future -     Ova and parasite examination; Future -     Stool culture; Future  Urinary incontinence, unspecified type Will check UA. Declined medications at today's visit. Further discussion in 1 month. -     Urinalysis, Routine w reflex microscopic  Abnormal blood level of iron Iron elevated at 165 with health insurance  screening labs. Will further investigate today. -     Ferritin -     Cancel: Transferrin -     IBC panel  Need for tetanus, diphtheria, and acellular pertussis (Tdap) vaccine -     Tdap vaccine greater than or equal to 7yo IM  Need for pneumococcal vaccine -     Cancel: Pneumococcal polysaccharide vaccine 23-valent greater than or equal to 2yo subcutaneous/IM; Future -     Pneumococcal polysaccharide vaccine 23-valent greater than or equal to 2yo subcutaneous/IM   . Reviewed expectations re: course of current medical issues. . Discussed self-management of symptoms. . Outlined signs and symptoms indicating need for more acute intervention. . Patient verbalized understanding and all questions were answered. . See orders for this visit as documented in the electronic medical record. . Patient received an After-Visit Summary.   Inda Coke, PA-C

## 2017-05-17 ENCOUNTER — Other Ambulatory Visit: Payer: BLUE CROSS/BLUE SHIELD

## 2017-05-17 DIAGNOSIS — R197 Diarrhea, unspecified: Secondary | ICD-10-CM

## 2017-05-17 LAB — URINALYSIS, ROUTINE W REFLEX MICROSCOPIC
Bilirubin Urine: NEGATIVE
HGB URINE DIPSTICK: NEGATIVE
Ketones, ur: NEGATIVE
Leukocytes, UA: NEGATIVE
NITRITE: POSITIVE — AB
RBC / HPF: NONE SEEN (ref 0–?)
Specific Gravity, Urine: 1.02 (ref 1.000–1.030)
Total Protein, Urine: NEGATIVE
URINE GLUCOSE: NEGATIVE
Urobilinogen, UA: 1 (ref 0.0–1.0)
pH: 6.5 (ref 5.0–8.0)

## 2017-05-17 NOTE — Addendum Note (Signed)
Addended by: Frutoso Chase A on: 05/17/2017 01:17 PM   Modules accepted: Orders

## 2017-05-18 ENCOUNTER — Encounter: Payer: Self-pay | Admitting: Physician Assistant

## 2017-05-18 LAB — T4: Thyroxine (T4): 6.5

## 2017-05-18 LAB — CLOSTRIDIUM DIFFICILE BY PCR: CDIFFPCR: NOT DETECTED

## 2017-05-18 LAB — FREE THYROXINE INDEX: Free Thyroxine Index: 1.7

## 2017-05-18 LAB — ESTIMATED GFR: GFR CALC NON AF AMER: 53

## 2017-05-18 LAB — T3 UPTAKE: T3 Uptake: 26

## 2017-05-18 LAB — OVA AND PARASITE EXAMINATION: OP: NONE SEEN

## 2017-05-19 ENCOUNTER — Other Ambulatory Visit: Payer: Self-pay | Admitting: Physician Assistant

## 2017-05-19 MED ORDER — CEPHALEXIN 500 MG PO CAPS: 500.0000 mg | ORAL_CAPSULE | Freq: Two times a day (BID) | ORAL | 0 refills | Status: DC

## 2017-05-21 LAB — STOOL CULTURE

## 2017-05-23 LAB — URINE CULTURE

## 2017-05-31 ENCOUNTER — Telehealth: Payer: Self-pay | Admitting: Physician Assistant

## 2017-05-31 NOTE — Telephone Encounter (Signed)
ROI fax to Dr.Phillips

## 2017-06-02 ENCOUNTER — Ambulatory Visit: Payer: BLUE CROSS/BLUE SHIELD | Admitting: Internal Medicine

## 2017-06-16 ENCOUNTER — Ambulatory Visit (INDEPENDENT_AMBULATORY_CARE_PROVIDER_SITE_OTHER): Payer: BLUE CROSS/BLUE SHIELD | Admitting: Physician Assistant

## 2017-06-16 ENCOUNTER — Encounter: Payer: Self-pay | Admitting: Physician Assistant

## 2017-06-16 VITALS — BP 140/80 | HR 67 | Temp 98.0°F | Ht 62.0 in | Wt 152.4 lb

## 2017-06-16 DIAGNOSIS — N3941 Urge incontinence: Secondary | ICD-10-CM | POA: Diagnosis not present

## 2017-06-16 DIAGNOSIS — K649 Unspecified hemorrhoids: Secondary | ICD-10-CM

## 2017-06-16 MED ORDER — MIRABEGRON ER 25 MG PO TB24: 25.0000 mg | ORAL_TABLET | Freq: Every day | ORAL | 1 refills | Status: DC

## 2017-06-16 NOTE — Progress Notes (Signed)
Carol [name] is a 71 y.o. female is here to discuss: urinary and bowel incontinence.  I acted as a Education administrator for [employer], PA-C Anselmo Pickler, LPN  History of Present Illness:   Chief Complaint  Patient presents with  . Follow-up  . urge incontinence of urine    getting worse, has been going on for about 9 months  . incontinence of stool    ? rectal prolapse    Other  This is a recurrent problem. Episode onset: Pt has been having urinary urge incontinence x 9 months and also now haviing bowel incontinence x 4 months. Pt is wearing a pad and this is becoming bothersome to her. The problem occurs 2 to 4 times per day. The problem has been gradually worsening. Associated symptoms include a change in bowel habit. Pertinent negatives include no abdominal pain, chest pain, chills, fever, headaches, nausea or vomiting. Nothing aggravates the symptoms. She has tried nothing for the symptoms.   She was having diarrhea but this has resolved. Denies fevers, chills, poor appetite. She does report that she had a colonoscopy about 4 years ago and states that some hemorrhoids were removed -- but we do not have these records and we are requesting them. After she has a bowel movement, she finds that she leaks stool throughout the rest of the day, and has to wear a pad for this.  She denies any stress incontinence with coughing/sneezing/etc. We performed UA at last visit as well as stool cultures. Urine culture was significant for E.Coli and she was treated with keflex. Stool cultures were negative.   Health Maintenance Due  Topic Date Due  . Hepatitis C Screening  02/05/1946  . COLONOSCOPY  04/20/1998  . MAMMOGRAM  11/12/2014    Past Medical History:  Diagnosis Date  . Asthma      Social History   Social History  . Marital status: Married    Spouse name: N/A  . Number of children: N/A  . Years of education: N/A   Occupational History  . Not on file.   Social History Main  Topics  . Smoking status: Never Smoker  . Smokeless tobacco: Never Used  . Alcohol use No  . Drug use: No  . Sexual activity: No   Other Topics Concern  . 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  Family History  Problem Relation Age of Onset  . COPD Mother   . COPD Father     PMHx, SurgHx, SocialHx, FamHx, Medications, and Allergies were reviewed in the Visit Navigator and updated as appropriate.   There are no active problems to display for this patient.   Social History  Substance Use Topics  . Smoking status: Never Smoker  . Smokeless tobacco: Never Used  . Alcohol use No    Current Medications and Allergies:    Current Outpatient Prescriptions:  Marland Kitchen  GABAPENTIN PO, Take by mouth 3 (three) times daily. , Disp: , Rfl:  .  mirabegron ER (MYRBETRIQ) 25 MG TB24 tablet, Take 1 tablet (25 mg total) by mouth daily., Disp: 30 tablet, Rfl: 1  No Known Allergies  Review of Systems   Review of Systems  Constitutional: Negative for chills, fever, malaise/fatigue and  weight loss.  Respiratory: Negative for shortness of breath.   Cardiovascular: Negative for chest pain, orthopnea, claudication and leg swelling.  Gastrointestinal: Positive for change in bowel habit. Negative for abdominal pain, blood in stool, constipation, diarrhea, heartburn, nausea and vomiting.  Genitourinary: Negative for dysuria, frequency, hematuria and urgency.  Neurological: Negative for dizziness, tingling and headaches.    Vitals:   Vitals:   06/16/17 0835  BP: 140/80  Pulse: 67  Temp: 98 F (36.7 C)  TempSrc: Oral  SpO2: 98%  Weight: 152 lb 6.1 oz (69.1 kg)  Height: 5\' 2"  (1.575 m)     Body mass index is 27.87 kg/m.   Physical Exam:    Physical Exam  Constitutional: She appears well-developed. She is  cooperative.  Non-toxic appearance. She does not have a sickly appearance. She does not appear ill. No distress.  Cardiovascular: Normal rate, regular rhythm, S1 normal, S2 normal, normal heart sounds and normal pulses.   No LE edema  Pulmonary/Chest: Effort normal and breath sounds normal.  Genitourinary: Vagina normal. Rectal exam shows external hemorrhoid.    There is no rash, tenderness or lesion on the right labia. There is tenderness and lesion on the left labia. There is no rash on the left labia.  Genitourinary Comments: External flesh-colored hemorrhoids present at 12 o'clock and 10 o' clock -- no evidence of thrombosis  Chaperones Anselmo Pickler, LPN and Dr. Dimas Chyle present in room  Neurological: She is alert. GCS eye subscore is 4. GCS verbal subscore is 5. GCS motor subscore is 6.  Skin: Skin is warm, dry and intact.  Psychiatric: She has a normal mood and affect. Her speech is normal and behavior is normal.  Nursing note and vitals reviewed.    Assessment and Plan:    Jessah was seen today for follow-up, urge incontinence of urine and incontinence of stool.  Diagnoses and all orders for this visit:  Urge incontinence of urine Discussed supportive care measures including: Kegel exercises, bladder training instructions, voiding diaries. She is agreeable to trialing Myrbetriq 25 mg tablet. Follow-up in 2 months to assess efficacy, sooner if needed. Also discussed use of barrier cream to help prevent further skin breakdown given need to constant use of pad -- follow-up if skin breakdown persists.  Hemorrhoids, unspecified hemorrhoid type Discussed with Dr. Dimas Chyle. Suspect that her hemorrhoids are contributing to stool leakage. Recommended evaluation for removal of hemorrhoids. She is agreeable to this -- I have put in a referral.  -     Ambulatory referral to Gastroenterology  Other orders -     mirabegron ER (MYRBETRIQ) 25 MG TB24 tablet; Take 1 tablet (25 mg  total) by mouth daily.   . Reviewed expectations re: course of current medical issues. . Discussed self-management of symptoms. . Outlined signs and symptoms indicating need for more acute intervention. . Patient verbalized understanding and all questions were answered. . See orders for this visit as documented in the electronic medical record. . Patient received an After Visit Summary.  CMA or LPN served as scribe during this visit. History, Physical, and Plan performed by medical provider. Documentation and orders reviewed and attested to.  Inda Coke, PA-C [place] [place] 06/16/2017  Follow-up: Return in about 2 months (around 08/16/2017) for urinary incontinence.

## 2017-06-16 NOTE — Patient Instructions (Signed)
Use over the counter Desitin or A&D ointment to prevent further skin breakdown when wearing a pad.  Take Myrbetriq daily, follow-up with [place] in 2 months, sooner if needed.  We will contact your GI doctor for evaluation of your hemorrhoids.

## 2017-06-26 ENCOUNTER — Encounter: Payer: Self-pay | Admitting: Physician Assistant

## 2017-06-26 DIAGNOSIS — M542 Cervicalgia: Secondary | ICD-10-CM | POA: Insufficient documentation

## 2017-06-26 DIAGNOSIS — N952 Postmenopausal atrophic vaginitis: Secondary | ICD-10-CM | POA: Insufficient documentation

## 2017-06-26 DIAGNOSIS — N3946 Mixed incontinence: Secondary | ICD-10-CM | POA: Insufficient documentation

## 2017-06-26 DIAGNOSIS — M8580 Other specified disorders of bone density and structure, unspecified site: Secondary | ICD-10-CM | POA: Insufficient documentation

## 2017-06-26 DIAGNOSIS — N950 Postmenopausal bleeding: Secondary | ICD-10-CM | POA: Insufficient documentation

## 2017-07-05 ENCOUNTER — Ambulatory Visit: Payer: BLUE CROSS/BLUE SHIELD | Admitting: Internal Medicine

## 2017-08-03 ENCOUNTER — Telehealth: Payer: Self-pay | Admitting: Physician Assistant

## 2017-08-03 NOTE — Telephone Encounter (Signed)
Fax from Dr. Oswald Hillock office informing they made 3 attempts to contact patient. Called on 10/9, 10/25, and 11/1, leaving a message each time. They have closed the referral on their end.

## 2017-08-04 NOTE — Telephone Encounter (Signed)
Noted, thank you.  Inda Coke PA-C 08/04/17

## 2017-08-14 ENCOUNTER — Ambulatory Visit: Payer: BLUE CROSS/BLUE SHIELD | Admitting: Internal Medicine

## 2017-08-14 ENCOUNTER — Encounter: Payer: Self-pay | Admitting: Internal Medicine

## 2017-08-14 VITALS — BP 120/72 | HR 74 | Ht 62.0 in | Wt 154.4 lb

## 2017-08-14 DIAGNOSIS — M792 Neuralgia and neuritis, unspecified: Secondary | ICD-10-CM | POA: Insufficient documentation

## 2017-08-14 NOTE — Progress Notes (Signed)
OFFICE NOTE  Chief Complaint:  Chest pain  Primary Care Physician: Inda Coke, PA  HPI:  Carol [name] is a 71 y.o. female with a past medial history significant for asthma and chronic neck pain with numerous prior surgeries.  She tells me that she had been treated with opiates for years and when her long-acting opiate was taken off the market, she was switched to a different medication.  This caused abnormalities in her liver enzymes and affected her kidney function, therefore she decided to stop taking opiates with the approval of her pain medicine doctor.  Presently, does not sound like she had any significant withdrawal, but has been off of opiates now for several months.  Shortly after that however she had an episode of intense central chest pain.  She reported as sharp and it took her breath away.  The episode lasted for about for 5 minutes and then resolved.  She had several other episodes later that week but then has had no further episodes for several weeks.  The episodes were associated with exertion or relieved by rest.  She exercises fairly regularly and walks on a daily basis without any limitations.  She has few cardiac risk factors other than age.  She is not a smoker although both parents smoked and had COPD.  She denies any significant dyslipidemia, hypertension, diabetes or other personal cardiac risk factors.  EKG performed today shows sinus rhythm at 74 without ischemia-personally reviewed.   PMHx:  Past Medical History:  Diagnosis Date  . Asthma     No past surgical history on file.  FAMHx:  Family History  Problem Relation Age of Onset  . COPD Mother   . COPD Father     SOCHx:   reports that  has never smoked. she has never used smokeless tobacco. She reports that she does not drink alcohol or use drugs.  ALLERGIES:  No Known Allergies  ROS: Pertinent items noted in HPI and remainder of comprehensive ROS otherwise negative.  HOME MEDS: Current  Outpatient Medications on File Prior to Visit  Medication Sig Dispense Refill  . GABAPENTIN PO Take by mouth 3 (three) times daily.     . mirabegron ER (MYRBETRIQ) 25 MG TB24 tablet Take 1 tablet (25 mg total) by mouth daily. 30 tablet 1   No current facility-administered medications on file prior to visit.     LABS/IMAGING: No results found for this or any previous visit (from the past 48 hour(s)). No results found.  LIPID PANEL:    Component Value Date/Time   CHOL 176 04/20/2017   TRIG 88 04/20/2017   HDL 61 04/20/2017   LDLCALC 97 04/20/2017     WEIGHTS: Wt Readings from Last 3 Encounters:  08/14/17 154 lb 6.4 oz (70 kg)  06/16/17 152 lb 6.1 oz (69.1 kg)  05/16/17 152 lb (68.9 kg)    VITALS: BP 120/72 (BP Location: Right Arm)   Pulse 74   Ht 5\' 2"  (1.575 m)   Wt 154 lb 6.4 oz (70 kg)   BMI 28.24 kg/m   EXAM: General appearance: alert and no distress Neck: no carotid bruit, no JVD, thyroid not enlarged, symmetric, no tenderness/mass/nodules and Limited range of motion due to prior fusion Lungs: clear to auscultation bilaterally Heart: regular rate and rhythm Abdomen: soft, non-tender; bowel sounds normal; no masses,  no organomegaly Extremities: extremities normal, atraumatic, no cyanosis or edema Pulses: 2+ and symmetric Skin: Skin color, texture, turgor normal. No rashes or lesions  Neurologic: Grossly normal Psych: Pleasant  EKG: Sinus rhythm at 74, low voltage QRS- personally reviewed  ASSESSMENT: 1. Atypical chest pain-suspect neuropathic 2. 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   Mrs. Buchan had a sharp, intense chest pain that occurred on 3 different occasions over a week but is not happened in several weeks.  She gets no symptoms when she exerts herself.  She has few cardiac risk factors other than her age.  There is no significant family history of coronary disease.  She has few cardiac risk factors.   Her EKG does not show any ischemia.  At this point I do not think there is any indication for any further cardiac workup.  I suspect her pain was neuropathic, especially since it occurred not long after she took herself off of opiates.  I have offered to see her back on an as-needed basis with the symptoms return.  Thanks for the kind referral.  Pixie Casino, MD, FACC, [place] Director of the [facility] Attending Cardiologist  Direct Dial: [phone]  Fax: [phone]  Website:  [email or web address] Nikitha Mode 08/14/2017, 12:56 PM

## 2017-08-14 NOTE — Patient Instructions (Signed)
Your physician recommends that you schedule a follow-up appointment as needed with Dr. Hilty.  

## 2017-08-15 ENCOUNTER — Ambulatory Visit: Payer: BLUE CROSS/BLUE SHIELD | Admitting: Physician Assistant

## 2017-08-16 NOTE — Addendum Note (Signed)
Addended by: Zebedee Iba on: 08/16/2017 03:19 PM   Modules accepted: Orders

## 2017-08-21 NOTE — Telephone Encounter (Signed)
No records from Dr.Phillips

## 2017-08-23 ENCOUNTER — Encounter: Payer: Self-pay | Admitting: Family Medicine

## 2017-08-23 ENCOUNTER — Ambulatory Visit: Payer: BLUE CROSS/BLUE SHIELD | Admitting: Family Medicine

## 2017-08-23 VITALS — BP 106/64 | HR 93 | Temp 97.6°F | Wt 150.6 lb

## 2017-08-23 DIAGNOSIS — R05 Cough: Secondary | ICD-10-CM

## 2017-08-23 DIAGNOSIS — R159 Full incontinence of feces: Secondary | ICD-10-CM

## 2017-08-23 DIAGNOSIS — N3946 Mixed incontinence: Secondary | ICD-10-CM

## 2017-08-23 DIAGNOSIS — R059 Cough, unspecified: Secondary | ICD-10-CM

## 2017-08-23 MED ORDER — IPRATROPIUM BROMIDE 0.06 % NA SOLN: 2.0000 | Freq: Four times a day (QID) | NASAL | 0 refills | Status: DC

## 2017-08-23 MED ORDER — BENZONATATE 200 MG PO CAPS: 200.0000 mg | ORAL_CAPSULE | Freq: Two times a day (BID) | ORAL | 0 refills | Status: DC | PRN

## 2017-08-23 MED ORDER — AZITHROMYCIN 250 MG PO TABS: ORAL_TABLET | ORAL | 0 refills | Status: DC

## 2017-08-23 NOTE — Progress Notes (Signed)
   Subjective:  Carol [name] is a 71 y.o. female who presents today with a chief complaint of urinary incontinence.   HPI:  Urinary Incontinence, established problem, Improving Patient seen for this 2 months ago.  Started on Myrbetriq 25 mg daily.  Is doing well on this dose without side effects.  Has noticed significant increase in her symptoms.  Stool Incontinence, established problem, stable Also seen for this 2 months ago.  She was concurrently diagnosed with hemorrhoids which were thought to be contributing.  She was referred to GI but has not been able to meet with him yet.  Symptoms are stable since her last visit.  She is sometimes having to use for 5 pads per day due to her incontinence.  Cough, acute issue Started 5-1/2 weeks ago.  Symptoms have been stable over that time.  No fevers.  No rhinorrhea, however does notice that she is frequently clearing her throat.  No sore throat.  No known sick contacts.  Symptoms are worse at night and in the morning.  Tried Mucinex which does not seem to be helping.  ROS: Per HPI  PMH: Smoking history reviewed.  Never smoker.  Objective:  Physical Exam: BP 106/64   Pulse 93   Temp 97.6 F (36.4 C) (Oral)   Wt 150 lb 9.6 oz (68.3 kg)   SpO2 96%   BMI 27.55 kg/m   Gen: NAD, resting comfortably HEENT: TMs clear bilaterally.  Maxillary sinuses with mildly decreased transillumination bilaterally.  Oropharynx slightly erythematous without exudate.  Nasal mucosa erythematous and boggy bilaterally with clear nasal discharge. CV: RRR with no murmurs appreciated Pulm: NWOB, CTAB with no crackles, wheezes, or rhonchi  Assessment/Plan:  Urinary incontinence Doing well on Myrbetriq 25 mg daily.  Continue this dose.  Continue Kegel exercises and lifestyle modifications.  Follow-up in 6-12 months, or sooner as needed.  Stool incontinence Stable since her last visit.  States that she will be following up with GI for further evaluation of her  hemorrhoids which were thought to be contributing.  Told patient she may need surgery eventually for this.  Cough Respiratory exam stable without signs or symptoms of bronchitis or pneumonia.  Likely upper respiratory.  Start Atrovent nasal spray and Tessalon for cough.  Given that her symptoms have persisted for 5-1/2 weeks, we will also send in a prescription for azithromycin.  Return precautions reviewed.  Follow-up as needed.  Algis Greenhouse. Jerline Pain, MD 08/23/2017 1:54 PM

## 2017-10-11 DIAGNOSIS — K648 Other hemorrhoids: Secondary | ICD-10-CM | POA: Insufficient documentation

## 2018-04-13 ENCOUNTER — Encounter: Payer: Self-pay | Admitting: *Deleted

## 2018-04-26 LAB — LIPID PANEL
Cholesterol: 174 (ref 0–200)
HDL: 53 (ref 35–70)
LDL CALC: 101
Triglycerides: 100 (ref 40–160)

## 2018-04-26 LAB — BASIC METABOLIC PANEL
BUN: 16 (ref 4–21)
Creatinine: 1 (ref 0.5–1.1)
GLUCOSE: 100
POTASSIUM: 4.5 (ref 3.4–5.3)
Sodium: 141 (ref 137–147)

## 2018-04-26 LAB — HEPATIC FUNCTION PANEL
ALT: 15 (ref 7–35)
AST: 19 (ref 13–35)
Alkaline Phosphatase: 91 (ref 25–125)
BILIRUBIN, TOTAL: 0.3

## 2018-04-26 LAB — CBC AND DIFFERENTIAL
HCT: 42 (ref 36–46)
Hemoglobin: 14.1 (ref 12.0–16.0)
Neutrophils Absolute: 2
PLATELETS: 273 (ref 150–399)
WBC: 4

## 2018-04-26 LAB — TSH: TSH: 3.17 (ref 0.41–5.90)

## 2018-04-26 LAB — VITAMIN D 25 HYDROXY (VIT D DEFICIENCY, FRACTURES): VIT D 25 HYDROXY: 32.8

## 2018-05-01 ENCOUNTER — Ambulatory Visit (INDEPENDENT_AMBULATORY_CARE_PROVIDER_SITE_OTHER): Payer: BLUE CROSS/BLUE SHIELD

## 2018-05-01 ENCOUNTER — Ambulatory Visit (INDEPENDENT_AMBULATORY_CARE_PROVIDER_SITE_OTHER): Payer: BLUE CROSS/BLUE SHIELD | Admitting: Physician Assistant

## 2018-05-01 ENCOUNTER — Encounter: Payer: Self-pay | Admitting: Physician Assistant

## 2018-05-01 VITALS — BP 120/78 | HR 67 | Temp 98.0°F | Ht 61.0 in | Wt 156.0 lb

## 2018-05-01 DIAGNOSIS — R109 Unspecified abdominal pain: Secondary | ICD-10-CM

## 2018-05-01 DIAGNOSIS — N3946 Mixed incontinence: Secondary | ICD-10-CM

## 2018-05-01 DIAGNOSIS — M8589 Other specified disorders of bone density and structure, multiple sites: Secondary | ICD-10-CM | POA: Diagnosis not present

## 2018-05-01 DIAGNOSIS — R159 Full incontinence of feces: Secondary | ICD-10-CM | POA: Diagnosis not present

## 2018-05-01 DIAGNOSIS — Z0001 Encounter for general adult medical examination with abnormal findings: Secondary | ICD-10-CM

## 2018-05-01 DIAGNOSIS — E785 Hyperlipidemia, unspecified: Secondary | ICD-10-CM | POA: Diagnosis not present

## 2018-05-01 DIAGNOSIS — E663 Overweight: Secondary | ICD-10-CM

## 2018-05-01 LAB — URINALYSIS, ROUTINE W REFLEX MICROSCOPIC
Bilirubin Urine: NEGATIVE
Hgb urine dipstick: NEGATIVE
KETONES UR: NEGATIVE
Leukocytes, UA: NEGATIVE
Nitrite: NEGATIVE
RBC / HPF: NONE SEEN (ref 0–?)
SPECIFIC GRAVITY, URINE: 1.01 (ref 1.000–1.030)
Total Protein, Urine: NEGATIVE
UROBILINOGEN UA: 0.2 (ref 0.0–1.0)
Urine Glucose: NEGATIVE
pH: 7 (ref 5.0–8.0)

## 2018-05-01 NOTE — Assessment & Plan Note (Signed)
Uncontrolled. Would like second opinion -- will refer to  GI to work-up this as well as her recent change in stool caliber.

## 2018-05-01 NOTE — Assessment & Plan Note (Signed)
Will order DEXA scan, needs update. Continue Vit D supplementation.

## 2018-05-01 NOTE — Patient Instructions (Addendum)
It was great to see you!  1. We will call you regarding your lab and xray results. 2. Please schedule a DEXA scan. 3. You will be contacted about your referral to [facility] gastroenterology. 4. Call us and let us know if you would like a urology referral.  If your back pain does not improve, please make a follow-up appointment with [place]. Consider discussing it with your acupuncturist too.  Health Maintenance, Female Adopting a healthy lifestyle and getting preventive care can go a long way to promote health and wellness. Talk with your health care provider about what schedule of regular examinations is right for you. This is a good chance for you to check in with your provider about disease prevention and staying healthy. In between checkups, there are plenty of things you can do on your own. Experts have done a lot of research about which lifestyle changes and preventive measures are most likely to keep you healthy. Ask your health care provider for more information. Weight and diet Eat a healthy diet  Be sure to include plenty of vegetables, fruits, low-fat dairy products, and lean protein.  Do not eat a lot of foods high in solid fats, added sugars, or salt.  Get regular exercise. This is one of the most important things you can do for your health. ? Most adults should exercise for at least 150 minutes each week. The exercise should increase your heart rate and make you sweat (moderate-intensity exercise). ? Most adults should also do strengthening exercises at least twice a week. This is in addition to the moderate-intensity exercise.  Maintain a healthy weight  Body mass index (BMI) is a measurement that can be used to identify possible weight problems. It estimates body fat based on height and weight. Your health care provider can help determine your BMI and help you achieve or maintain a healthy weight.  For females 37 years of age and older: ? A BMI below 18.5 is considered  underweight. ? A BMI of 18.5 to 24.9 is normal. ? A BMI of 25 to 29.9 is considered overweight. ? A BMI of 30 and above is considered obese.  Watch levels of cholesterol and blood lipids  You should start having your blood tested for lipids and cholesterol at 70 years of age, then have this test every 5 years.  You may need to have your cholesterol levels checked more often if: ? Your lipid or cholesterol levels are high. ? You are older than 70 years of age. ? You are at high risk for heart disease.  Cancer screening Lung Cancer  Lung cancer screening is recommended for adults 57-57 years old who are at high risk for lung cancer because of a history of smoking.  A yearly low-dose CT scan of the lungs is recommended for people who: ? Currently smoke. ? Have quit within the past 15 years. ? Have at least a 30-pack-year history of smoking. A pack year is smoking an average of one pack of cigarettes a day for 1 year.  Yearly screening should continue until it has been 15 years since you quit.  Yearly screening should stop if you develop a health problem that would prevent you from having lung cancer treatment.  Breast Cancer  Practice breast self-awareness. This means understanding how your breasts normally appear and feel.  It also means doing regular breast self-exams. Let your health care provider know about any changes, no matter how small.  If you are in your 60s  or 21s, you should have a clinical breast exam (CBE) by a health care provider every 1-3 years as part of a regular health exam.  If you are 41 or older, have a CBE every year. Also consider having a breast X-ray (mammogram) every year.  If you have a family history of breast cancer, talk to your health care provider about genetic screening.  If you are at high risk for breast cancer, talk to your health care provider about having an MRI and a mammogram every year.  Breast cancer gene (BRCA) assessment is  recommended for women who have family members with BRCA-related cancers. BRCA-related cancers include: ? Breast. ? Ovarian. ? Tubal. ? Peritoneal cancers.  Results of the assessment will determine the need for genetic counseling and BRCA1 and BRCA2 testing.  Cervical Cancer Your health care provider may recommend that you be screened regularly for cancer of the pelvic organs (ovaries, uterus, and vagina). This screening involves a pelvic examination, including checking for microscopic changes to the surface of your cervix (Pap test). You may be encouraged to have this screening done every 3 years, beginning at age 37.  For women ages 41-65, health care providers may recommend pelvic exams and Pap testing every 3 years, or they may recommend the Pap and pelvic exam, combined with testing for human papilloma virus (HPV), every 5 years. Some types of HPV increase your risk of cervical cancer. Testing for HPV may also be done on women of any age with unclear Pap test results.  Other health care providers may not recommend any screening for nonpregnant women who are considered low risk for pelvic cancer and who do not have symptoms. Ask your health care provider if a screening pelvic exam is right for you.  If you have had past treatment for cervical cancer or a condition that could lead to cancer, you need Pap tests and screening for cancer for at least 20 years after your treatment. If Pap tests have been discontinued, your risk factors (such as having a new sexual partner) need to be reassessed to determine if screening should resume. Some women have medical problems that increase the chance of getting cervical cancer. In these cases, your health care provider may recommend more frequent screening and Pap tests.  Colorectal Cancer  This type of cancer can be detected and often prevented.  Routine colorectal cancer screening usually begins at 70 years of age and continues through 70 years of  age.  Your health care provider may recommend screening at an earlier age if you have risk factors for colon cancer.  Your health care provider may also recommend using home test kits to check for hidden blood in the stool.  A small camera at the end of a tube can be used to examine your colon directly (sigmoidoscopy or colonoscopy). This is done to check for the earliest forms of colorectal cancer.  Routine screening usually begins at age 56.  Direct examination of the colon should be repeated every 5-10 years through 70 years of age. However, you may need to be screened more often if early forms of precancerous polyps or small growths are found.  Skin Cancer  Check your skin from head to toe regularly.  Tell your health care provider about any new moles or changes in moles, especially if there is a change in a mole's shape or color.  Also tell your health care provider if you have a mole that is larger than the size of a  pencil eraser.  Always use sunscreen. Apply sunscreen liberally and repeatedly throughout the day.  Protect yourself by wearing long sleeves, pants, a wide-brimmed hat, and sunglasses whenever you are outside.  Heart disease, diabetes, and high blood pressure  High blood pressure causes heart disease and increases the risk of stroke. High blood pressure is more likely to develop in: ? People who have blood pressure in the high end of the normal range (130-139/85-89 mm Hg). ? People who are overweight or obese. ? People who are African American.  If you are 58-50 years of age, have your blood pressure checked every 3-5 years. If you are 51 years of age or older, have your blood pressure checked every year. You should have your blood pressure measured twice-once when you are at a hospital or clinic, and once when you are not at a hospital or clinic. Record the average of the two measurements. To check your blood pressure when you are not at a hospital or clinic, you  can use: ? An automated blood pressure machine at a pharmacy. ? A home blood pressure monitor.  If you are between 75 years and 32 years old, ask your health care provider if you should take aspirin to prevent strokes.  Have regular diabetes screenings. This involves taking a blood sample to check your fasting blood sugar level. ? If you are at a normal weight and have a low risk for diabetes, have this test once every three years after 70 years of age. ? If you are overweight and have a high risk for diabetes, consider being tested at a younger age or more often. Preventing infection Hepatitis B  If you have a higher risk for hepatitis B, you should be screened for this virus. You are considered at high risk for hepatitis B if: ? You were born in a country where hepatitis B is common. Ask your health care provider which countries are considered high risk. ? Your parents were born in a high-risk country, and you have not been immunized against hepatitis B (hepatitis B vaccine). ? You have HIV or AIDS. ? You use needles to inject street drugs. ? You live with someone who has hepatitis B. ? You have had sex with someone who has hepatitis B. ? You get hemodialysis treatment. ? You take certain medicines for conditions, including cancer, organ transplantation, and autoimmune conditions.  Hepatitis C  Blood testing is recommended for: ? Everyone born from [birth year] through 1963. ? Anyone with known risk factors for hepatitis C.  Sexually transmitted infections (STIs)  You should be screened for sexually transmitted infections (STIs) including gonorrhea and chlamydia if: ? You are sexually active and are younger than 70 years of age. ? You are older than 70 years of age and your health care provider tells you that you are at risk for this type of infection. ? Your sexual activity has changed since you were last screened and you are at an increased risk for chlamydia or gonorrhea. Ask your  health care provider if you are at risk.  If you do not have HIV, but are at risk, it may be recommended that you take a prescription medicine daily to prevent HIV infection. This is called pre-exposure prophylaxis (PrEP). You are considered at risk if: ? You are sexually active and do not regularly use condoms or know the HIV status of your partner(s). ? You take drugs by injection. ? You are sexually active with a partner who has HIV.  Talk with your health care provider about whether you are at high risk of being infected with HIV. If you choose to begin PrEP, you should first be tested for HIV. You should then be tested every 3 months for as long as you are taking PrEP. Pregnancy  If you are premenopausal and you may become pregnant, ask your health care provider about preconception counseling.  If you may become pregnant, take 400 to 800 micrograms (mcg) of folic acid every day.  If you want to prevent pregnancy, talk to your health care provider about birth control (contraception). Osteoporosis and menopause  Osteoporosis is a disease in which the bones lose minerals and strength with aging. This can result in serious bone fractures. Your risk for osteoporosis can be identified using a bone density scan.  If you are 36 years of age or older, or if you are at risk for osteoporosis and fractures, ask your health care provider if you should be screened.  Ask your health care provider whether you should take a calcium or vitamin D supplement to lower your risk for osteoporosis.  Menopause may have certain physical symptoms and risks.  Hormone replacement therapy may reduce some of these symptoms and risks. Talk to your health care provider about whether hormone replacement therapy is right for you. Follow these instructions at home:  Schedule regular health, dental, and eye exams.  Stay current with your immunizations.  Do not use any tobacco products including cigarettes, chewing  tobacco, or electronic cigarettes.  If you are pregnant, do not drink alcohol.  If you are breastfeeding, limit how much and how often you drink alcohol.  Limit alcohol intake to no more than 1 drink per day for nonpregnant women. One drink equals 12 ounces of beer, 5 ounces of wine, or 1 ounces of hard liquor.  Do not use street drugs.  Do not share needles.  Ask your health care provider for help if you need support or information about quitting drugs.  Tell your health care provider if you often feel depressed.  Tell your health care provider if you have ever been abused or do not feel safe at home. This information is not intended to replace advice given to you by your health care provider. Make sure you discuss any questions you have with your health care provider. Document Released: 03/28/2011 Document Revised: 02/18/2016 Document Reviewed: 06/16/2015 Elsevier Interactive Patient Education  [employer].

## 2018-05-01 NOTE — Progress Notes (Signed)
I acted as a Education administrator for [employer], PA-C Anselmo Pickler, LPN   Subjective:    Carol [name] is a 72 y.o. female and is here for a comprehensive physical exam.   HPI  Health Maintenance Due  Topic Date Due  . Hepatitis C Screening  [birth year]  . MAMMOGRAM  11/12/2014   She has a copy of her labs today from her insurance company's wellness labs:  CMP normal except glucose 100 H Lipid panel normal except LDL 101 H Thyroid panel normal CBC normal Vit D normal  Acute Concerns: R flank pain -- for several weeks to a couple of months, patient has had dull, constant pain with occasional sharp breakthrough pain. Has had a kidney stone in the past, several years ago. Will occasionally take Aleve if needed. Is not getting worse with time. Denies any inciting events.  Chronic Issues: Incontinence of stools -- had banding 3 times in Jan 2019 with Dr. Earlean Shawl and didn't have any change in her symptoms, and reports that she was told that she will need to return for more banding to see if that will help -- she has not returned. Has urge to defecate and sometimes doesn't make it to the bathroom in time. Has to wear a pad because of this. It is affecting her quality of life. She also reports that her stool caliber has changed and stools are more thin than usual. Last colonoscopy was in 2015 with [facility]. Osteopenia -- last DEXA in 2014, taking Vit D religiously Incontinence of urine -- did have some relief with Myrbetriq but not enough to stop wearing a pad, so she doesn't want to resume it at this time. Has not seen a urologist. Neck pain -- does acupuncture every other week, Dr. Myles Rosenthal manages this her gabapentin (was on opioids for 20+ years)  ASCVD today is 8.1%  Health Maintenance: Immunizations -- UTD Colonoscopy -- UTD, due 02/2024 Mammogram -- overdue, pt will schedule PAP -- N/A Bone Density -- done 10/2012 Diet -- variable, doesn't eat much fruit Sleep habits -- sleep is  not as good, as she has been aging Exercise -- walk 2.5 miles daily Weight -- Weight: 156 lb (70.8 kg)  Weight history: Wt Readings from Last 10 Encounters:  05/01/18 156 lb (70.8 kg)  08/23/17 150 lb 9.6 oz (68.3 kg)  08/14/17 154 lb 6.4 oz (70 kg)  06/16/17 152 lb 6.1 oz (69.1 kg)  05/16/17 152 lb (68.9 kg)   No LMP recorded. Patient is postmenopausal. Alcohol use: none Tobacco use: none  Depression screen PHQ 2/9 05/01/2018  Decreased Interest 0  Down, Depressed, Hopeless 1  PHQ - 2 Score 1     Other providers/specialists: Patient Care Team: Inda Coke, [place] as PCP - General (Physician Assistant) Dr. Hardin Negus -- pain mgmt   PMHx, SurgHx, SocialHx, Medications, and Allergies were reviewed in the Visit Navigator and updated as appropriate.   Past Medical History:  Diagnosis Date  . Asthma     History reviewed. No pertinent surgical history.   Family History  Problem Relation Age of Onset  . COPD Mother   . COPD Father     Social History   Tobacco Use  . Smoking status: Never Smoker  . Smokeless tobacco: Never Used  Substance Use Topics  . Alcohol use: No  . Drug use: No    Review of Systems:   Review of Systems  Constitutional: Negative.  Negative for chills, fever, malaise/fatigue and weight loss.  HENT: Negative.  Negative for hearing loss, sinus pain and sore throat.   Eyes: Negative.  Negative for blurred vision.  Respiratory: Negative.  Negative for cough and shortness of breath.   Cardiovascular: Negative.  Negative for chest pain, palpitations and leg swelling.  Gastrointestinal: Negative.  Negative for abdominal pain, constipation, diarrhea, heartburn, nausea and vomiting.  Genitourinary: Negative.  Negative for dysuria, frequency and urgency.  Musculoskeletal: Positive for neck pain (Sees Dr. Hardin Negus, pain management). Negative for back pain and myalgias.  Skin: Negative.  Negative for itching and rash.  Neurological: Positive for  headaches (Chronic due to Neck pain). Negative for dizziness, tingling, seizures and loss of consciousness.  Endo/Heme/Allergies: Negative.  Negative for polydipsia.  Psychiatric/Behavioral: Negative.  Negative for depression. The patient is not nervous/anxious.     Objective:   BP 120/78 (BP Location: Left Arm, Patient Position: Sitting, Cuff Size: Normal)   Pulse 67   Temp 98 F (36.7 C) (Oral)   Ht 5\' 1"  (1.549 m)   Wt 156 lb (70.8 kg)   SpO2 98%   BMI 29.48 kg/m  Body mass index is 29.48 kg/m.   General Appearance:    Alert, cooperative, no distress, appears stated age  Head:    Normocephalic, without obvious abnormality, atraumatic  Eyes:    PERRL, conjunctiva/corneas clear, EOM's intact, fundi    benign, both eyes  Ears:    Normal TM's and external ear canals, both ears  Nose:   Nares normal, septum midline, mucosa normal, no drainage    or sinus tenderness  Throat:   Lips, mucosa, and tongue normal; teeth and gums normal  Neck:   Supple, symmetrical, trachea midline, no adenopathy;    thyroid:  no enlargement/tenderness/nodules; no carotid   bruit or JVD  Back:     Symmetric, no curvature, ROM normal, no CVA tenderness No decreased ROM 2/2 pain with flexion/extension, lateral side bends, or rotation. No reproducible tenderness with deep palpation to bilateral paraspinal muscles. No bony tenderness. No evidence of erythema, rash or ecchymosis.   Lungs:     Clear to auscultation bilaterally, respirations unlabored  Chest Wall:    No tenderness or deformity   Heart:    Regular rate and rhythm, S1 and S2 normal, no murmur, rub   or gallop  Breast Exam:    Deferred  Abdomen:     Soft, non-tender, bowel sounds active all four quadrants,    no masses, no organomegaly  Genitalia:    Deferred  Extremities:   Extremities normal, atraumatic, no cyanosis or edema  Pulses:   2+ and symmetric all extremities  Skin:   Skin color, texture, turgor normal, no rashes or lesions  Lymph  nodes:   Cervical, supraclavicular, and axillary nodes normal  Neurologic:   CNII-XII intact, normal strength, sensation and reflexes    throughout   Results for orders placed or performed in visit on 05/01/18  Urinalysis, Routine w reflex microscopic  Result Value Ref Range   Color, Urine YELLOW Yellow;Lt. Yellow   APPearance CLEAR Clear   Specific Gravity, Urine 1.010 1.000 - 1.030   pH 7.0 5.0 - 8.0   Total Protein, Urine NEGATIVE Negative   Urine Glucose NEGATIVE Negative   Ketones, ur NEGATIVE Negative   Bilirubin Urine NEGATIVE Negative   Hgb urine dipstick NEGATIVE Negative   Urobilinogen, UA 0.2 0.0 - 1.0   Leukocytes, UA NEGATIVE Negative   Nitrite NEGATIVE Negative   WBC, UA 0-2/hpf 0-2/hpf   RBC /  HPF none seen 0-2/hpf   Squamous Epithelial / LPF Rare(0-4/hpf) Rare(0-4/hpf)   Lumbar xray: without acute abnormalities  Assessment/Plan:   Problem List Items Addressed This Visit      Musculoskeletal and Integument   Osteopenia    Will order DEXA scan, needs update. Continue Vit D supplementation.      Relevant Orders   DG Bone Density     Other   Mixed stress and urge urinary incontinence    Uncontrolled. Has tried Myrbetriq 25 mg in the past with partial success. Would like to defer urology consultation at this time, as she would like to treat her fecal incontinence first.      Relevant Orders   Urinalysis, Routine w reflex microscopic   Incontinence of feces    Uncontrolled. Would like second opinion -- will refer to [facility] to work-up this as well as her recent change in stool caliber.      Hyperlipidemia    Controlled. Current ASCVD is 8.1%. Discussed use of statin and change in ASCVD score, she declines medication at this time. Continue diet and exercise.       Other Visit Diagnoses    Encounter for general adult medical examination with abnormal findings    -  Primary Today patient counseled on age appropriate routine health concerns for  screening and prevention, each reviewed and up to date or declined. Immunizations reviewed and up to date or declined. Labs ordered and reviewed. Risk factors for depression reviewed and negative. Hearing function and visual acuity are intact. ADLs screened and addressed as needed. Functional ability and level of safety reviewed and appropriate. Education, counseling and referrals performed based on assessed risks today. Patient provided with a copy of personalized plan for preventive services.    Overweight Overall doing well with diet and exercise, trying to lose weight.  Flank pain     Xray without acute findings. UA normal. Suspect MSK strain. Follow-up if symptoms worsen or persist despite treatment.   Relevant Orders   Urinalysis, Routine w reflex microscopic   DG Lumbar Spine 2-3 Views       Well Adult Exam: Labs ordered: Yes. Patient counseling was done. See below for items discussed. Discussed the patient's BMI.  The BMI BMI is not in the acceptable range; BMI management plan is completed Follow up as needed for acute illness. Breast cancer screening: overdue, patient will schedule. Cervical cancer screening: n/a   Patient Counseling: [x]    Nutrition: Stressed importance of moderation in sodium/caffeine intake, saturated fat and cholesterol, caloric balance, sufficient intake of fresh fruits, vegetables, fiber, calcium, iron, and 1 mg of folate supplement per day (for females capable of pregnancy).  [x]    Stressed the importance of regular exercise.   [x]    Substance Abuse: Discussed cessation/primary prevention of tobacco, alcohol, or other drug use; driving or other dangerous activities under the influence; availability of treatment for abuse.   [x]    Injury prevention: Discussed safety belts, safety helmets, smoke detector, smoking near bedding or upholstery.   [x]    Sexuality: Discussed sexually transmitted diseases, partner selection, use of condoms, avoidance of unintended  pregnancy  and contraceptive alternatives.  [x]    Dental health: Discussed importance of regular tooth brushing, flossing, and dental visits.  [x]    Health maintenance and immunizations reviewed. Please refer to Health maintenance section.   CMA or LPN served as scribe during this visit. History, Physical, and Plan performed by medical provider. Documentation and orders reviewed and attested to.  Inda Coke, PA-C [place]

## 2018-05-01 NOTE — Assessment & Plan Note (Signed)
Controlled. Current ASCVD is 8.1%. Discussed use of statin and change in ASCVD score, she declines medication at this time. Continue diet and exercise.

## 2018-05-01 NOTE — Assessment & Plan Note (Signed)
Uncontrolled. Has tried Myrbetriq 25 mg in the past with partial success. Would like to defer urology consultation at this time, as she would like to treat her fecal incontinence first.

## 2018-05-02 ENCOUNTER — Encounter: Payer: Self-pay | Admitting: Physician Assistant

## 2018-05-02 ENCOUNTER — Ambulatory Visit (INDEPENDENT_AMBULATORY_CARE_PROVIDER_SITE_OTHER)
Admission: RE | Admit: 2018-05-02 | Discharge: 2018-05-02 | Disposition: A | Discharge status: Home / Self Care | Payer: BLUE CROSS/BLUE SHIELD | Source: Ambulatory Visit | Attending: Physician Assistant | Admitting: Physician Assistant

## 2018-05-02 DIAGNOSIS — M8589 Other specified disorders of bone density and structure, multiple sites: Secondary | ICD-10-CM | POA: Diagnosis not present

## 2018-05-06 DIAGNOSIS — M8589 Other specified disorders of bone density and structure, multiple sites: Secondary | ICD-10-CM | POA: Diagnosis not present

## 2018-05-16 ENCOUNTER — Encounter: Payer: Self-pay | Admitting: Physician Assistant

## 2018-05-16 LAB — FREE THYROXINE INDEX: Free Thyroxine Index: 1.6

## 2018-05-16 LAB — T3 UPTAKE: T3 Uptake: 28

## 2018-05-16 LAB — T4: T4 TOTAL: 5.7

## 2018-09-21 ENCOUNTER — Ambulatory Visit (INDEPENDENT_AMBULATORY_CARE_PROVIDER_SITE_OTHER): Payer: BLUE CROSS/BLUE SHIELD | Admitting: Orthopaedic Surgery

## 2018-09-21 ENCOUNTER — Ambulatory Visit (INDEPENDENT_AMBULATORY_CARE_PROVIDER_SITE_OTHER): Payer: BLUE CROSS/BLUE SHIELD

## 2018-09-21 ENCOUNTER — Encounter (INDEPENDENT_AMBULATORY_CARE_PROVIDER_SITE_OTHER): Payer: Self-pay | Admitting: Orthopaedic Surgery

## 2018-09-21 VITALS — BP 144/87 | HR 84 | Ht 61.0 in | Wt 150.0 lb

## 2018-09-21 DIAGNOSIS — M542 Cervicalgia: Secondary | ICD-10-CM

## 2018-09-21 DIAGNOSIS — Z981 Arthrodesis status: Secondary | ICD-10-CM | POA: Diagnosis not present

## 2018-09-21 MED ORDER — PREDNISONE 10 MG (21) PO TBPK: ORAL_TABLET | ORAL | 1 refills | Status: DC

## 2018-09-21 NOTE — Progress Notes (Signed)
Office Visit Note   Patient: Carol [name]           Date of Birth: 12/02/1945           MRN: [id number] Visit Date: 09/21/2018              Requested by: Inda Coke, [place] [address] [place], [place] [address] PCP: Inda Coke, [place]   Assessment & Plan: Visit Diagnoses:  1. Neck pain   2. S/P cervical spinal fusion   3.  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  Plan: We will have her use the cervical collar intermittently.  Prednisone Dosepak prescribed.  We will check her back again in 2 weeks.  This likely is the right C2-3 facet above her solid fusion C3-C7.  Follow-Up Instructions: Return in about 2 weeks (around 10/05/2018).   Orders:  Orders Placed This Encounter  Procedures  . XR Cervical Spine 2 or 3 views   Meds ordered this encounter  Medications  . predniSONE (STERAPRED UNI-PAK 21 TAB) 10 MG (21) TBPK tablet    Sig: Take as directed with food. 6,5,4,3,2,1 daily then stop    Dispense:  21 tablet    Refill:  1      Procedures: No procedures performed   Clinical Data: No additional findings.   Subjective: Chief Complaint  Patient presents with  . Neck - Pain    HPI 72-year-old female returns with recurrent neck pain.  She has had total cervical procedures in the past and is fused from C3-C7 with combinations of anterior posterior fusion.  She does acupuncture and states she started having posterior neck pain more in the right than left where she has C2-3 facet disease.  He states pain is been batched take gabapentin which makes her sleepy.  Denies any bowel or bladder symptoms no fever or chills. Review of Systems positive for history of hyperlipidemia cervical spondylosis multilevel fusions.  Osteopenia.  Positive for kidney stones.   Objective: Vital Signs: BP (!) 144/87   Pulse 84   Ht 5\' 1"  (1.549 m)   Wt 150 lb (68 kg)   BMI 28.34 kg/m   Physical Exam Constitutional:      Appearance: She is well-developed.  HENT:     Head:  Normocephalic.     Right Ear: External ear normal.     Left Ear: External ear normal.  Eyes:     Pupils: Pupils are equal, round, and reactive to light.  Neck:     Thyroid: No thyromegaly.     Trachea: No tracheal deviation.  Cardiovascular:     Rate and Rhythm: Normal rate.  Pulmonary:     Effort: Pulmonary effort is normal.  Abdominal:     Palpations: Abdomen is soft.  Skin:    General: Skin is warm and dry.  Neurological:     Mental Status: She is alert and oriented to person, place, and time.  Psychiatric:        Behavior: Behavior normal.     Ortho Exam patient has discomfort with rotation of the left tenderness paraspinal near C2-3 on the right no tenderness on the left.  Upper extremity reflexes are 2+ and symmetrical.  No isolated motor weakness negative impingement of the shoulders no lower extremity clonus no lower extremity hyperreflexia normal heel toe gait.  Specialty Comments:  No specialty comments available.  Imaging: No results found.   PMFS History: Patient Active Problem List   Diagnosis Date Noted  . S/P cervical spinal  fusion 09/24/2018  . Incontinence of feces 05/01/2018  . Hyperlipidemia 05/01/2018  . Internal hemorrhoids 10/11/2017  . Neuropathic pain of chest 08/14/2017  . 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 Osteopenia 06/26/2017  . Postmenopausal bleeding 06/26/2017  . Neck pain 06/26/2017   Past Medical History:  Diagnosis Date  . Asthma     Family History  Problem Relation Age of Onset  . COPD Mother   . COPD Father     No past surgical history on file. Social History   Occupational History  . Not on file  Tobacco Use  . Smoking status: Never Smoker  . Smokeless tobacco: Never Used  Substance and Sexual Activity  . Alcohol use: No  . Drug use: No  . Sexual activity: Never

## 2018-09-24 ENCOUNTER — Encounter (INDEPENDENT_AMBULATORY_CARE_PROVIDER_SITE_OTHER): Payer: Self-pay | Admitting: Orthopaedic Surgery

## 2018-09-24 DIAGNOSIS — Z981 Arthrodesis status: Secondary | ICD-10-CM | POA: Insufficient documentation

## 2018-10-05 ENCOUNTER — Ambulatory Visit (INDEPENDENT_AMBULATORY_CARE_PROVIDER_SITE_OTHER): Payer: BLUE CROSS/BLUE SHIELD | Admitting: Orthopaedic Surgery

## 2018-10-05 ENCOUNTER — Encounter (INDEPENDENT_AMBULATORY_CARE_PROVIDER_SITE_OTHER): Payer: Self-pay | Admitting: Orthopaedic Surgery

## 2018-10-05 VITALS — BP 126/53 | HR 73

## 2018-10-05 DIAGNOSIS — Z981 Arthrodesis status: Secondary | ICD-10-CM

## 2018-10-05 DIAGNOSIS — M47812 Spondylosis without myelopathy or radiculopathy, cervical region: Secondary | ICD-10-CM | POA: Diagnosis not present

## 2018-10-05 DIAGNOSIS — M542 Cervicalgia: Secondary | ICD-10-CM

## 2018-10-08 ENCOUNTER — Telehealth (INDEPENDENT_AMBULATORY_CARE_PROVIDER_SITE_OTHER): Payer: Self-pay | Admitting: *Deleted

## 2018-10-08 ENCOUNTER — Encounter (INDEPENDENT_AMBULATORY_CARE_PROVIDER_SITE_OTHER): Payer: Self-pay | Admitting: Orthopaedic Surgery

## 2018-10-08 DIAGNOSIS — M47812 Spondylosis without myelopathy or radiculopathy, cervical region: Secondary | ICD-10-CM | POA: Insufficient documentation

## 2018-10-08 NOTE — Progress Notes (Signed)
Office Visit Note   Patient: Carol [name]           Date of Birth: September 28, 1945           MRN: [id number] Visit Date: 10/05/2018              Requested by: Inda Coke, [place] [address] [place], [place] [address] PCP: Inda Coke, [place]   Assessment & Plan: Visit Diagnoses:  1. Neck pain   2. S/P cervical spinal fusion   3. Facet arthropathy, cervical     Plan: We discussed trying see 2-3 facet injection see if she gets relief with this.  This is not successful she will need repeat MRI imaging of the cervical spine.  Follow-Up Instructions: No follow-ups on file.   Orders:  Orders Placed This Encounter  Procedures  . Ambulatory referral to Physical Medicine Rehab   No orders of the defined types were placed in this encounter.     Procedures: No procedures performed   Clinical Data: No additional findings.   Subjective: Chief Complaint  Patient presents with  . Neck - Pain, Follow-up    HPI 73-year-old female returns post cervical fusion C3-C7 with facet arthropathy on the right at C2-3.  Patient is gotten some relief from the prednisone taper use of the collar intermittently but is having persistent pain on the right upper cervical region posteriorly with motion.  Solid fusion C3-C7 by CT scan.  Facet arthropathy at C2-3 on the right.  He denies radicular symptoms but has significant severe pain with rotation of her neck on the right upper cervical.  No lower extremity weakness no myelopathic symptoms  Review of Systems positive for hyperlipidemia history of hemorrhoids, multiple cervical fusions solid C3-C7 anteriorly and posteriorly.  Positive for asthma.  Patient is a  non-smoker.   Objective: Vital Signs: BP (!) 126/53 (BP Location: Left Arm, Patient Position: Sitting, Cuff Size: Normal)   Pulse 73   Physical Exam Constitutional:      Appearance: She is well-developed.  HENT:     Head: Normocephalic.     Right Ear: External ear normal.    Left Ear: External ear normal.  Eyes:     Pupils: Pupils are equal, round, and reactive to light.  Neck:     Thyroid: No thyromegaly.     Trachea: No tracheal deviation.  Cardiovascular:     Rate and Rhythm: Normal rate.  Pulmonary:     Effort: Pulmonary effort is normal.  Abdominal:     Palpations: Abdomen is soft.  Skin:    General: Skin is warm and dry.  Neurological:     Mental Status: She is alert and oriented to person, place, and time.  Psychiatric:        Behavior: Behavior normal.     Ortho Exam patient has paraspinal tenderness right upper cervical region.  No increased pain with cervical compression no change with distraction.  Upper extremity reflexes are 2+ and symmetrical.  No impingement the shoulders no lower extremity clonus normal heel toe gait without lower extremity weakness.  Distal pulses are 2+.  Specialty Comments:  No specialty comments available.  Imaging: No results found.   PMFS History: Patient Active Problem List   Diagnosis Date Noted  . S/P cervical spinal fusion 09/24/2018  . Incontinence of feces 05/01/2018  . Hyperlipidemia 05/01/2018  . Internal hemorrhoids 10/11/2017  . Neuropathic pain of chest 08/14/2017  . 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 Mixed stress and  urge urinary incontinence 06/26/2017  . Osteopenia 06/26/2017  . Postmenopausal bleeding 06/26/2017  . Neck pain 06/26/2017   Past Medical History:  Diagnosis Date  . Asthma     Family History  Problem Relation Age of Onset  . COPD Mother   . COPD Father     No past surgical history on file. Social History   Occupational History  . Not on file  Tobacco Use  . Smoking status: Never Smoker  . Smokeless tobacco: Never Used  Substance and Sexual Activity  . Alcohol use: No  . Drug use: No  . Sexual activity: Never

## 2018-10-25 ENCOUNTER — Encounter (INDEPENDENT_AMBULATORY_CARE_PROVIDER_SITE_OTHER): Payer: Self-pay | Admitting: Physical Medicine and Rehabilitation

## 2018-10-25 ENCOUNTER — Ambulatory Visit (INDEPENDENT_AMBULATORY_CARE_PROVIDER_SITE_OTHER): Payer: BLUE CROSS/BLUE SHIELD | Admitting: Physical Medicine and Rehabilitation

## 2018-10-25 ENCOUNTER — Ambulatory Visit (INDEPENDENT_AMBULATORY_CARE_PROVIDER_SITE_OTHER): Payer: Self-pay

## 2018-10-25 VITALS — BP 121/69 | HR 80 | Temp 97.8°F

## 2018-10-25 DIAGNOSIS — M47812 Spondylosis without myelopathy or radiculopathy, cervical region: Secondary | ICD-10-CM

## 2018-10-25 MED ORDER — METHYLPREDNISOLONE ACETATE 80 MG/ML IJ SUSP
80.0000 mg | Freq: Once | INTRAMUSCULAR | Status: AC
  Administered 2018-10-25: 80 mg

## 2018-10-25 NOTE — Progress Notes (Signed)
Carol [name] - 73 y.o. female MRN [id number]  Date of birth: 12/05/1945  Office Visit Note: Visit Date: 10/25/2018 PCP: Inda Coke, [place] Referred by: Inda Coke, [place]  Subjective: Chief Complaint  Patient presents with  . Neck - Pain   HPI:  Carol [name] is a 73 y.o. female who comes in today At the request of Dr. Rodell Perna for right C2-3 facet joint block above her fusion.  She has had prior cervical fusion both posterior and anterior.  Initial surgery by Dr. Laverta Baltimore and subsequent surgery by Dr. Lorin Mercy.  Was doing fairly well up until recently now with right-sided neck pain with some referral in the arm.  This is been ongoing for a few months and no real relief.  X-rays show adjacent level facet arthropathy.  If no relief suggest updated MRI.  ROS Otherwise per HPI.  Assessment & Plan: Visit Diagnoses:  1. Cervical spondylosis without myelopathy     Plan: Findings:  Patient seemed to have some relief during the anesthetic phase.    Meds & Orders:  Meds ordered this encounter  Medications  . methylPREDNISolone acetate (DEPO-MEDROL) injection 80 mg    Orders Placed This Encounter  Procedures  . Facet Injection  . XR C-ARM NO REPORT    Follow-up: Return if symptoms worsen or fail to improve, for Rodell Perna, MD.   Procedures: No procedures performed  Cervical Facet Joint Intra-Articular Injection with Fluoroscopic Guidance  Patient: Carol [name]      Date of Birth: November 08, 1945 MRN: [id number] PCP: Inda Coke, [place]      Visit Date: 10/25/2018   Universal Protocol:    Date/Time: 01/30/201:35 PM  Consent Given By: the patient  Position: lateral  Additional Comments: Vital signs were monitored before and after the procedure. Patient was prepped and draped in the usual sterile fashion. The correct patient, procedure, and site was verified.   Injection Procedure Details:  Procedure Site One Meds Administered:  Meds ordered this encounter   Medications  . methylPREDNISolone acetate (DEPO-MEDROL) injection 80 mg     Laterality: Right  Location/Site:  C2-3  Needle size: 25 G  Needle type: spinal needle  Needle Placement: Articular  Findings:  -Contrast Used: 0.5 mL iohexol 180 mg iodine/mL   -Comments: Excellent flow of contrast producing a partial arthrogram.  Procedure Details: The fluoroscope beam was manipulated to achieve the best "true" lateral view possible by squaring off the endplates with cranial and caudal tilt and using varying obliquity to achieve the a view with the longest length of spinous process.   The region overlying the facet joints mentioned above were then localized under fluoroscopic visualization. For each target described below the skin was anesthetized with 1 ml of 1% Lidocaine without epinephrine. The needle was inserted down to the level of the lateral mass of the superior articular process of the  facet joint to be injected. Then, the needle was "walked off" inferiorly into the lateral aspect of the facet joint. Bi-planar images were used for confirming placement and spot radiographs were documented. Radiographs were obtained of the arthrogram. A 0.5 ml. volume of the steroid/anesthetic solution was injected into the joint. This procedure was repeated for each facet joint injected.   Additional Comments:  The patient tolerated the procedure well Dressing: Band-Aid    Post-procedure details: Patient was observed during the procedure. Post-procedure instructions were reviewed.  Patient left the clinic in stable condition.    Clinical History: 09/21/2018  AP lateral  cervical spine x-rays with lateral flexion-extension x-rays  demonstrate previous multilevel cervical fusion. She is fused from C3-C7.  Few millimeters anterolisthesis at C7-T1. Mild to space narrowing C2-C3  unchanged from CT scan 2014 cervical spine. Previously noted broken screw  from anterior plate with solid  interbody fusions now.  Impression: Previous multilevel 4 level fusions with lateral mass plate  posteriorly anterior plates and posterior interspinous wiring.     Objective:  VS:  HT:    WT:   BMI:     BP:121/69  HR:80bpm  TEMP:97.8 F (36.6 C)(Oral)  RESP:  Physical Exam  Ortho Exam Imaging: Xr C-arm No Report  Result Date: 10/25/2018 Please see Notes tab for imaging impression.

## 2018-10-25 NOTE — Procedures (Signed)
Cervical Facet Joint Intra-Articular Injection with Fluoroscopic Guidance  Patient: Carol [name]      Date of Birth: 09/16/1946 MRN: [id number] PCP: Inda Coke, [place]      Visit Date: 10/25/2018   Universal Protocol:    Date/Time: 01/30/201:35 PM  Consent Given By: the patient  Position: lateral  Additional Comments: Vital signs were monitored before and after the procedure. Patient was prepped and draped in the usual sterile fashion. The correct patient, procedure, and site was verified.   Injection Procedure Details:  Procedure Site One Meds Administered:  Meds ordered this encounter  Medications  . methylPREDNISolone acetate (DEPO-MEDROL) injection 80 mg     Laterality: Right  Location/Site:  C2-3  Needle size: 25 G  Needle type: spinal needle  Needle Placement: Articular  Findings:  -Contrast Used: 0.5 mL iohexol 180 mg iodine/mL   -Comments: Excellent flow of contrast producing a partial arthrogram.  Procedure Details: The fluoroscope beam was manipulated to achieve the best "true" lateral view possible by squaring off the endplates with cranial and caudal tilt and using varying obliquity to achieve the a view with the longest length of spinous process.   The region overlying the facet joints mentioned above were then localized under fluoroscopic visualization. For each target described below the skin was anesthetized with 1 ml of 1% Lidocaine without epinephrine. The needle was inserted down to the level of the lateral mass of the superior articular process of the  facet joint to be injected. Then, the needle was "walked off" inferiorly into the lateral aspect of the facet joint. Bi-planar images were used for confirming placement and spot radiographs were documented. Radiographs were obtained of the arthrogram. A 0.5 ml. volume of the steroid/anesthetic solution was injected into the joint. This procedure was repeated for each facet joint  injected.   Additional Comments:  The patient tolerated the procedure well Dressing: Band-Aid    Post-procedure details: Patient was observed during the procedure. Post-procedure instructions were reviewed.  Patient left the clinic in stable condition.

## 2018-10-25 NOTE — Progress Notes (Signed)
 .  Numeric Pain Rating Scale and Functional Assessment Average Pain 8   In the last MONTH (on 0-10 scale) has pain interfered with the following?  1. General activity like being  able to carry out your everyday physical activities such as walking, climbing stairs, carrying groceries, or moving a chair?  Rating(7)   +Driver, -BT, -Dye Allergies.  

## 2018-11-16 ENCOUNTER — Other Ambulatory Visit: Payer: Self-pay | Admitting: Anesthesiology

## 2018-11-16 DIAGNOSIS — M542 Cervicalgia: Secondary | ICD-10-CM

## 2018-11-27 ENCOUNTER — Ambulatory Visit
Admission: RE | Admit: 2018-11-27 | Discharge: 2018-11-27 | Disposition: A | Discharge status: Home / Self Care | Payer: BLUE CROSS/BLUE SHIELD | Source: Ambulatory Visit | Attending: Anesthesiology | Admitting: Anesthesiology

## 2018-11-27 DIAGNOSIS — M542 Cervicalgia: Secondary | ICD-10-CM

## 2018-11-27 MED ORDER — GADOBENATE DIMEGLUMINE 529 MG/ML IV SOLN
14.0000 mL | Freq: Once | INTRAVENOUS | Status: AC | PRN
  Administered 2018-11-27: 14 mL via INTRAVENOUS

## 2019-01-28 ENCOUNTER — Other Ambulatory Visit: Payer: Self-pay

## 2019-01-28 ENCOUNTER — Ambulatory Visit: Payer: BLUE CROSS/BLUE SHIELD | Admitting: Family Medicine

## 2019-01-28 ENCOUNTER — Encounter: Payer: Self-pay | Admitting: Family Medicine

## 2019-01-28 ENCOUNTER — Telehealth: Payer: Self-pay | Admitting: *Deleted

## 2019-01-28 VITALS — BP 128/82 | HR 76 | Temp 97.7°F | Ht 61.0 in | Wt 159.8 lb

## 2019-01-28 DIAGNOSIS — R55 Syncope and collapse: Secondary | ICD-10-CM

## 2019-01-28 DIAGNOSIS — M542 Cervicalgia: Secondary | ICD-10-CM

## 2019-01-28 LAB — CBC
HCT: 41.1 % (ref 36.0–46.0)
Hemoglobin: 14.5 g/dL (ref 12.0–15.0)
MCHC: 35.2 g/dL (ref 30.0–36.0)
MCV: 87.7 fl (ref 78.0–100.0)
Platelets: 285 10*3/uL (ref 150.0–400.0)
RBC: 4.68 Mil/uL (ref 3.87–5.11)
RDW: 12.6 % (ref 11.5–15.5)
WBC: 6.6 10*3/uL (ref 4.0–10.5)

## 2019-01-28 LAB — COMPREHENSIVE METABOLIC PANEL
ALT: 18 U/L (ref 0–35)
AST: 18 U/L (ref 0–37)
Albumin: 4.4 g/dL (ref 3.5–5.2)
Alkaline Phosphatase: 93 U/L (ref 39–117)
BUN: 14 mg/dL (ref 6–23)
CO2: 23 mEq/L (ref 19–32)
Calcium: 9.3 mg/dL (ref 8.4–10.5)
Chloride: 106 mEq/L (ref 96–112)
Creatinine, Ser: 0.83 mg/dL (ref 0.40–1.20)
GFR: 67.81 mL/min (ref >60.00)
Glucose, Bld: 92 mg/dL (ref 70–99)
Potassium: 4.3 mEq/L (ref 3.5–5.1)
Sodium: 139 mEq/L (ref 135–145)
Total Bilirubin: 0.3 mg/dL (ref 0.2–1.2)
Total Protein: 6.5 g/dL (ref 6.0–8.3)

## 2019-01-28 LAB — TSH: TSH: 2.25 u[IU]/mL (ref 0.35–4.50)

## 2019-01-28 NOTE — Patient Instructions (Signed)
It was very nice to see you today!  Your EKG today is normal.  We will check blood work.  I will refer you to the neurologist.  Please let me know if your symptoms return.   Take care, Dr Jerline Pain

## 2019-01-28 NOTE — Telephone Encounter (Signed)
Called pt back and told her Carol [name] is out of the office today and she would like you to see Carol [name]. Pt verbalized understanding. Appt scheduled for 1:00 PM today with Carol [name]. Pt verbalized understanding.

## 2019-01-28 NOTE — Telephone Encounter (Signed)
Due to working from home, will have patient see in-office provider. Please schedule with Dr. Dimas Chyle.

## 2019-01-28 NOTE — Progress Notes (Signed)
   Chief Complaint:  Carol [name] is a 73 y.o. female who presents for same day appointment with a chief complaint of loss of consciousness.   Assessment/Plan:  Syncope Concern for possible seizure.  EKG without abnormality and history not consistent with cardiac etiology.  Will check CBC, C met, and TSH.  Normal neurological exam here.  Will place referral to neurology for further management/evaluation.  Discussed reasons to return to care and seek emergent care.  Chronic Neck Pain Continue management per neurosurgery and pain management. Will need to avoid tramadol until seizure is ruled out.      Subjective:  HPI:  Loss of Consciousness, acute problem Patient suffered an episode about 5 days ago in which she lost consciousness. She was in her usual state of health when she walked into her kitchen and lost consciousness. Reportedly her husband told her that she was confused prior to the episode. She does not remember anything just before the episode or for about an hour afterwards. Her husband checked on her about  An hour later and she was back to her normal self, though felt a "fuzzy headed." She bit her tongue during the fall. No reported convulsions.  No reported weakness or numbness. No other obvious alleviating or aggravating factors. She did well for the past 5 days however mentioned her symptoms to her pain specialist today who advised her to follow up to rule out TIA.   ROS: Per HPI  PMH: She reports that she has never smoked. She has never used smokeless tobacco. She reports that she does not drink alcohol or use drugs.      Objective:  Physical Exam: BP 128/82 (BP Location: Left Arm, Patient Position: Sitting, Cuff Size: Normal)   Pulse 76   Temp 97.7 F (36.5 C) (Oral)   Ht '5\' 1"'$  (1.549 m)   Wt 159 lb 12 oz (72.5 kg)   SpO2 97%   BMI 30.18 kg/m   Gen: NAD, resting comfortably CV: Regular rate and rhythm with no murmurs appreciated Pulm: Normal work of  breathing, clear to auscultation bilaterally with no crackles, wheezes, or rhonchi GI: Normal bowel sounds present. Soft, Nontender, Nondistended. MSK: No edema, cyanosis, or clubbing noted Skin: Warm, dry Neuro: CN2-12 intact. Finger-nose-finger testing intact bilaterally. Strength 5/5 in upper and lower extremities.  Psych: Normal affect and thought content  EKG: NSR no acute ischemic changes      Caleb M. Jerline Pain, MD 01/28/2019 1:17 PM

## 2019-01-28 NOTE — Telephone Encounter (Signed)
Spoke to pt, episode happened last Wednesday lasted about an hour. Pt does not remember anything. Pt fell and hit the floor, she said she bit her tongue hard it is bruised. Pt has headaches everyday but they are worse since fall and has pain in back and neck. Pt denies dizziness. Told pt I will talk to [facility] and get back to her if she wants her to come in to the office or to virtual visit. Pt verbalized understanding.

## 2019-01-28 NOTE — Telephone Encounter (Signed)
Copied from [place] [id number]. Topic: Appointment Scheduling - Scheduling Inquiry for Clinic >> Jan 28, 2019  9:18 AM Vernona Rieger wrote: Reason for CRM:  Patient said last Wednesday that she passed out. She contacted her pain doctor and they advised her to call her pcp. She said she walked into the kitchen and told her husband that she was confused and then she hit the floor. Her pain doctor thinks she had a TIA and would like for her to have some blood work done. She said she has been fine ever since.  Attempted office 3x >> Jan 28, 2019  9:26 AM Carole Binning B wrote: Schedule doxy with provider asap >> Jan 28, 2019  9:32 AM Janeece Fitting wrote: Butch Penny, after forwarding I realized this needs to be triaged even though the patient already spoke with pain doctor. Please contact patient to advise.

## 2019-01-29 NOTE — Progress Notes (Signed)
Dr Marigene Ehlers interpretation of your lab work:  Good news! Your blood work is all NORMAL. We do not need to do any further testing at this time. Please let me know if you have not heard from the neurologist in the next couple of days.   If you have any additional questions, please give [place] a call or send [place] a message through [place].  Take care, Dr Jerline Pain

## 2019-02-22 ENCOUNTER — Other Ambulatory Visit: Payer: Self-pay

## 2019-02-22 ENCOUNTER — Telehealth (INDEPENDENT_AMBULATORY_CARE_PROVIDER_SITE_OTHER): Payer: BLUE CROSS/BLUE SHIELD | Admitting: Neurology

## 2019-02-22 DIAGNOSIS — R55 Syncope and collapse: Secondary | ICD-10-CM
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
## 2019-03-06 ENCOUNTER — Ambulatory Visit (INDEPENDENT_AMBULATORY_CARE_PROVIDER_SITE_OTHER): Payer: BC Managed Care – PPO | Admitting: Neurology

## 2019-03-06 ENCOUNTER — Other Ambulatory Visit: Payer: Self-pay

## 2019-03-06 DIAGNOSIS — R55 Syncope and collapse: Secondary | ICD-10-CM

## 2019-03-08 NOTE — Procedures (Signed)
ELECTROENCEPHALOGRAM REPORT  Date of Study: 03/06/2019  Patient's Name: Carol [name] MRN: [id number] Date of Birth: May 04, 1946  Referring Provider: Dr. Ellouise Newer  Clinical History: This is a 73 year old woman with an episode of loss of consciousness in April preceded by confusion.   Medications: Gabapentin, Tramadol  Technical Summary: A multichannel digital 1-hour EEG recording measured by the international 10-20 system with electrodes applied with paste and impedances below 5000 ohms performed in our laboratory with EKG monitoring in an awake and asleep patient.  Hyperventilation was not performed. Photic stimulation was performed.  The digital EEG was referentially recorded, reformatted, and digitally filtered in a variety of bipolar and referential montages for optimal display.    Description: The patient is awake and asleep during the recording.  During maximal wakefulness, there is a symmetric, medium voltage 9 Hz posterior dominant rhythm that attenuates with eye opening.  The record is symmetric.  During drowsiness and sleep, there is an increase in theta slowing of the background.  Vertex waves and symmetric sleep spindles were seen. There were occasional small sharp spikes in sleep seen over the bilateral temporal regions, a normal variant with no pathological significance.Photic stimulation did not elicit any abnormalities.  There were no epileptiform discharges or electrographic seizures seen.    EKG lead was unremarkable.  Impression: This 1-hour awake and asleep EEG is normal.    Clinical Correlation: A normal EEG does not exclude a clinical diagnosis of epilepsy.  If further clinical questions remain, prolonged EEG may be helpful.  Clinical correlation is advised.   Ellouise Newer, M.D.

## 2019-03-11 ENCOUNTER — Telehealth: Payer: Self-pay

## 2019-03-11 NOTE — Telephone Encounter (Signed)
Pt called informed that  the EEG showed normal brain waves. Proceed with MRI brain as discussed. Pt stated she had seen on mychart, no questions asked, pt verbalized understanding

## 2019-04-03 ENCOUNTER — Telehealth: Payer: Self-pay

## 2019-04-03 ENCOUNTER — Ambulatory Visit
Admission: RE | Admit: 2019-04-03 | Discharge: 2019-04-03 | Disposition: A | Discharge status: Home / Self Care | Payer: BC Managed Care – PPO | Source: Ambulatory Visit | Attending: Neurology | Admitting: Neurology

## 2019-04-03 DIAGNOSIS — R55 Syncope and collapse: Secondary | ICD-10-CM

## 2019-04-03 MED ORDER — GADOBENATE DIMEGLUMINE 529 MG/ML IV SOLN
14.0000 mL | Freq: Once | INTRAVENOUS | Status: AC | PRN
  Administered 2019-04-03: 14 mL via INTRAVENOUS

## 2019-04-03 NOTE — Telephone Encounter (Signed)
-----   Message from Cameron Sprang, MD sent at 04/03/2019  1:16 PM EDT ----- Pls let patient know MRI brain looks good, no evidence of tumor, stroke, or bleed. Call us if any change in symptoms. Thanks

## 2019-04-03 NOTE — Telephone Encounter (Signed)
Pt informed of normal MRI results

## 2019-05-06 ENCOUNTER — Telehealth: Payer: Self-pay | Admitting: Neurology

## 2019-05-06 ENCOUNTER — Ambulatory Visit: Payer: Self-pay

## 2019-05-06 DIAGNOSIS — R55 Syncope and collapse: Secondary | ICD-10-CM

## 2019-05-06 NOTE — Telephone Encounter (Signed)
Patient is having black out spells. She had one Wednesday night until Thursday morning, She doesn't remember anything. She had a bruise on her cheek and her jaw/head hurt. The first black out spell was on April 29th. Dr. Jerline Pain wanted her to call and let Dr. Delice Lesch know. When she wake up she had made herself a tiny paper towel pack in between her teeth. Thanks!

## 2019-05-06 NOTE — Telephone Encounter (Signed)
Patient stated she possibly hit her head due to fainting,has a bruise on jaw and ear,her head has pain.Informed patient that she needs to go to the ED to get checked out due to hitting her head. Also informed to call the neurologist to inform them and scheduled a an appt. Please advise

## 2019-05-06 NOTE — Telephone Encounter (Signed)
Incoming call from Patient with a complaint of passing ot.  dont remember anything. States she woke up with a paper towel in her mouth. Does not remember how it got there .  Does not have a Hx of any seizure activity.  Occured once before in April. Desires an appointment.   Reason for Disposition . Simple fainting is a chronic symptom (has occurred multiple times)  Answer Assessment - Initial Assessment Questions 1. ONSET: "How long were you unconscious?" (minutes) "When did it happen?"   Wednesday night  2. CONTENT: "What happened during period of unconsciousness?" (e.g., seizure activity)      3. MENTAL STATUS: "Alert and oriented now?" (oriented x 3 = name, month, location)     oriented 4. TRIGGER: "What do you think caused the fainting?" "What were you doing just before you fainted?"  (e.g., exercise, sudden standing up, prolonged standing)     Dont rememer.  5. RECURRENT SYMPTOM: "Have you ever passed out before?" If so, ask: "When was the last time?" and "What happened that time?"     Yes April 6. INJURY: "Did you sustain any injury during the fall?"       7. CARDIAC SYMPTOMS: "Have you had any of the following symptoms: chest pain, difficulty breathing, palpitations?"     denies 8. NEUROLOGIC SYMPTOMS: "Have you had any of the following symptoms: headache, numbness, vertigo, weakness?"    Headaches 9. GI SYMPTOMS: "Have you had any of the following symptoms: abdominal pain, vomiting, diarrhea, blood in stools?"   Denies  10. OTHER SYMPTOMS: "Do you have any other symptoms?"       denies 11. PREGNANCY: "Is there any chance you are pregnant?" "When was your last menstrual period?"       *No Answer*  Protocols used: [facility]

## 2019-05-06 NOTE — Telephone Encounter (Signed)
Noted. Agree with plan -- ER and follow-up with neuro.

## 2019-05-06 NOTE — Telephone Encounter (Signed)
FYI, see message. 

## 2019-05-07 MED ORDER — ZONISAMIDE 100 MG PO CAPS: ORAL_CAPSULE | ORAL | 5 refills | Status: DC

## 2019-05-07 NOTE — Telephone Encounter (Signed)
During the night last Wednesday patient had a "black out" and when she woke up had a bruise on right cheek (almost gone now). Bit tongue and was swollen ( now hard to eat). When she woke up had a paper towel folded in her mouth and it was covered in blood.  Patient is concerned has no recollection whatsoever this time of the black out. When it happened April 29 she had family members with her and was alone this time.   Patient had televisit with Dr. Delice Lesch May 29 regarding the first black out on April 29. This is the first time it has happened since then. When she told her PCP Dr. Jerline Pain he wanted patient to let Dr. Delice Lesch know.  Patient has no idea anything that may have led to this - no alcohol, no change in medications. Usually just sleeps about 5 hours a night with letting dogs out during the night. Informed patient will let Dr. Delice Lesch know.

## 2019-05-07 NOTE — Telephone Encounter (Signed)
Spoke to patient about episode last Wed, she went to bed fine then woke up the next morning with a paper towel soaked in blood in her mouth, she did not recall how this happened. Her tongue was swollen, sore, bitten on the right side. No incontinence. No clear triggers. Discussed normal MRI and EEG, however with recurrent events and this episode more concerning for seizure, would start seizure medication. She is agreeable to start Zonisamide 100mg  qhs, side effects discussed. Also discussed doing a 24-hour EEG to further classify events. [place] driving laws discussed, no driving after an episode of loss of consciousness/awareness until 6 months event-free.  Manuela Schwartz, pls order Ambulatory EEG (on notes: pls write 24-hour EEG) and let Georgeanna Lea know to schedule, thanks!

## 2019-05-08 NOTE — Telephone Encounter (Signed)
Rx was sent, just ambulatory EEG order, thanks!

## 2019-05-08 NOTE — Telephone Encounter (Signed)
EEG (ambulatory) scheduled and Carol [name] made aware.   Dr Delice Lesch - it appears you ordered/sent in the Zonisamide 100mg  QHS to pharmacy - just wanted to make sure this was not something you needed me to do. Thanks.

## 2019-05-14 ENCOUNTER — Other Ambulatory Visit: Payer: Self-pay | Admitting: Neurology

## 2019-05-14 ENCOUNTER — Encounter: Payer: Self-pay | Admitting: Family Medicine

## 2019-05-14 ENCOUNTER — Ambulatory Visit (INDEPENDENT_AMBULATORY_CARE_PROVIDER_SITE_OTHER): Payer: BC Managed Care – PPO | Admitting: Family Medicine

## 2019-05-14 DIAGNOSIS — R55 Syncope and collapse: Secondary | ICD-10-CM

## 2019-05-14 DIAGNOSIS — R7989 Other specified abnormal findings of blood chemistry: Secondary | ICD-10-CM

## 2019-05-14 DIAGNOSIS — K644 Residual hemorrhoidal skin tags: Secondary | ICD-10-CM

## 2019-05-14 DIAGNOSIS — E785 Hyperlipidemia, unspecified: Secondary | ICD-10-CM

## 2019-05-14 DIAGNOSIS — R159 Full incontinence of feces: Secondary | ICD-10-CM | POA: Diagnosis not present

## 2019-05-14 NOTE — Progress Notes (Signed)
   Chief Complaint:  Carol [name] is a 73 y.o. female who presents today for a virtual office visit with a chief complaint of syncope.   Assessment/Plan:  Syncope Unclear if her most recent spell was a syncopal episode as it occurred while she was sleeping.  It is possible that she could have some underlying seizure disorder.  She has a neurology appointment later this month and has 24-hour EEG pending as well.  Advised her to continue follow-up with neurology.  She will continue taking her Zonegran. Discussed reasons to return to care or seek emergent care.  Hemorrhoid/rectal leakage Place referral to surgery.  Elevated creatinine Reassured patient her elevation was not significant.  Encourage good oral hydration  Elevated cholesterol Patient will be sending over full lipid panel.  Encouraged her to continue working on diet and exercise.    Subjective:  HPI:  Syncope Patient seen a few months ago for syncopal episode.  She had referral with neurology who found no significant abnormalities.  She had done well until about a week ago when she woke up with a paper towel in her mouth.  Does not member how it got there.  States that it was soaked in blood from her tongue.  She called her neurologist who was concerned about possible seizure-like activity and then she was started on Zonegran.  She has not had any episodes since then.  Most recent episode occurred in the late night.  Patient thinks it is possible that she could have fallen out of bed and bit her tongue.  She does not remember anything regarding the episode.  She has been tolerating Zonegran however noticed that this caused some increased irritability in the mornings.  Patient has a few other problems outlined below 1.  Hemorrhoids/rectal leakage.  Several year history.  Stable over that time.  Has to wear depends due to leakage.  No pain.  No treatments tried.  2.  Lab abnormalities.  Patient recently had lab work done as  part of her insurance.  Was found to have a creatinine of 1.03 and cholesterol of 105.  She was told that both of these levels were elevated.  ROS: Per HPI  PMH: She reports that she has never smoked. She has never used smokeless tobacco. She reports that she does not drink alcohol or use drugs.      Objective/Observations  Physical Exam: Gen: NAD, resting comfortably Pulm: Normal work of breathing Neuro: Grossly normal, moves all extremities Psych: Normal affect and thought content  Virtual Visit via Video   I connected with Carol [name] on 05/14/19 at 10:40 AM EDT by a video enabled telemedicine application and verified that I am speaking with the correct person using two identifiers. I discussed the limitations of evaluation and management by telemedicine and the availability of in person appointments. The patient expressed understanding and agreed to proceed.   Patient location: Home Provider location: [place] participating in the virtual visit: Myself and Patient     Algis Greenhouse. Jerline Pain, MD 05/14/2019 12:22 PM

## 2019-05-22 ENCOUNTER — Ambulatory Visit (INDEPENDENT_AMBULATORY_CARE_PROVIDER_SITE_OTHER): Payer: BC Managed Care – PPO | Admitting: Neurology

## 2019-05-22 ENCOUNTER — Other Ambulatory Visit: Payer: Self-pay

## 2019-05-22 DIAGNOSIS — R55 Syncope and collapse: Secondary | ICD-10-CM

## 2019-06-05 ENCOUNTER — Encounter: Payer: Self-pay | Admitting: Family Medicine

## 2019-06-17 ENCOUNTER — Telehealth: Payer: Self-pay | Admitting: Neurology

## 2019-06-17 DIAGNOSIS — G40009 Localization-related (focal) (partial) idiopathic epilepsy and epileptic syndromes with seizures of localized onset, not intractable, without status epilepticus: Secondary | ICD-10-CM

## 2019-06-17 MED ORDER — OXCARBAZEPINE 300 MG PO TABS: ORAL_TABLET | ORAL | 11 refills | Status: DC

## 2019-06-17 MED ORDER — ZONISAMIDE 100 MG PO CAPS: ORAL_CAPSULE | ORAL | 0 refills | Status: DC

## 2019-06-17 NOTE — Procedures (Signed)
ELECTROENCEPHALOGRAM REPORT  Dates of Recording: 05/22/2019 10:34AM to 05/23/2019 10:38AM  Patient's Name: Carol [name] MRN: [id number] Date of Birth: 12/13/1945  Referring Provider: Dr. Ellouise Newer  Procedure: 24-hour ambulatory video EEG  History: This is a 73 year old woman with recurrent episodes of loss of consciousness/awareness. EEG for classification.   Medications: Zonisamide, Gabapentin, Tramadol  Technical Summary: This is a 24-hour multichannel digital video EEG recording measured by the international 10-20 system with electrodes applied with paste and impedances below 5000 ohms performed as portable with EKG monitoring.  The digital EEG was referentially recorded, reformatted, and digitally filtered in a variety of bipolar and referential montages for optimal display.    DESCRIPTION OF RECORDING: During maximal wakefulness, the background activity consisted of a symmetric 9 Hz posterior dominant rhythm which was reactive to eye opening.  There were no epileptiform discharges or focal slowing seen in wakefulness.  During the recording, the patient progresses through wakefulness, drowsiness, and Stage 2 sleep. There were frequent independent epileptiform discharges seen over the left > right anterior temporal regions.   Events:  There were no electrographic seizures seen.  EKG lead was unremarkable.  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   CLINICAL CORRELATION of the above findings indicates a tendency for seizures to arise from the bilateral temporal regions. There were no electrographic seizures captured in this study.     Ellouise Newer, M.D.

## 2019-06-17 NOTE — Telephone Encounter (Signed)
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 She has not had any further episodes since starting Zonisamide, but is irritable in the morning. She would like to switch to a different medication, we discussed starting oxcarbazepine 300mg  1/2 tab BID x 1 week, then increase to 300mg  BID. After 2 weeks of taking oxcarbazepine, stop the zonisamide. She will f/u in 1 month and knows to call for any changes

## 2019-07-19 ENCOUNTER — Other Ambulatory Visit: Payer: Self-pay

## 2019-07-19 ENCOUNTER — Ambulatory Visit (INDEPENDENT_AMBULATORY_CARE_PROVIDER_SITE_OTHER): Payer: BC Managed Care – PPO | Admitting: Neurology

## 2019-07-19 ENCOUNTER — Encounter: Payer: Self-pay | Admitting: Neurology

## 2019-07-19 VITALS — BP 126/84 | HR 78 | Ht 61.0 in | Wt 150.0 lb

## 2019-07-19 DIAGNOSIS — G40009 Localization-related (focal) (partial) idiopathic epilepsy and epileptic syndromes with seizures of localized onset, not intractable, without status epilepticus: Secondary | ICD-10-CM | POA: Diagnosis not present

## 2019-07-19 MED ORDER — OXCARBAZEPINE 300 MG PO TABS: ORAL_TABLET | ORAL | 3 refills | Status: DC
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
## 2019-07-31 NOTE — Progress Notes (Signed)
Office note fax to patient Pain Doctor Dr. Nicholaus Bloom

## 2019-10-05 ENCOUNTER — Other Ambulatory Visit (HOSPITAL_COMMUNITY)
Admission: RE | Admit: 2019-10-05 | Discharge: 2019-10-05 | Disposition: A | Discharge status: Home / Self Care | Payer: BC Managed Care – PPO | Source: Ambulatory Visit | Attending: General Surgery | Admitting: General Surgery

## 2019-10-05 DIAGNOSIS — Z20822 Contact with and (suspected) exposure to covid-19: Secondary | ICD-10-CM | POA: Insufficient documentation

## 2019-10-05 DIAGNOSIS — Z01812 Encounter for preprocedural laboratory examination: Secondary | ICD-10-CM | POA: Insufficient documentation

## 2019-10-06 LAB — NOVEL CORONAVIRUS, NAA (HOSP ORDER, SEND-OUT TO REF LAB; TAT 18-24 HRS): SARS-CoV-2, NAA: NOT DETECTED

## 2019-10-08 NOTE — Progress Notes (Signed)
Spoke with patient, answered questions.  Patient has quarantined without symptoms.  Arrival time 0815 am

## 2019-10-09 ENCOUNTER — Encounter (HOSPITAL_COMMUNITY): Payer: Self-pay | Admitting: General Surgery

## 2019-10-09 ENCOUNTER — Ambulatory Visit (HOSPITAL_COMMUNITY)
Admission: RE | Admit: 2019-10-09 | Discharge: 2019-10-09 | Disposition: A | Discharge status: Home / Self Care | Payer: BC Managed Care – PPO | Attending: General Surgery | Admitting: General Surgery

## 2019-10-09 ENCOUNTER — Encounter (HOSPITAL_COMMUNITY)
Admission: RE | Disposition: A | Discharge status: Home / Self Care | Payer: Self-pay | Source: Home / Self Care | Attending: General Surgery

## 2019-10-09 DIAGNOSIS — R159 Full incontinence of feces: Secondary | ICD-10-CM | POA: Diagnosis present

## 2019-10-09 HISTORY — PX: ANAL RECTAL MANOMETRY: SHX6358

## 2019-10-09 SURGERY — MANOMETRY, ANORECTAL

## 2019-10-09 NOTE — Progress Notes (Signed)
Anal manometry was performed with 2 nurses.  Patient tolerated well.  Patient was able to expel 50 cc balloon at 30 seconds.

## 2019-10-10 ENCOUNTER — Encounter: Payer: Self-pay | Admitting: *Deleted

## 2019-10-14 ENCOUNTER — Ambulatory Visit: Payer: Self-pay | Admitting: General Surgery

## 2019-10-14 NOTE — H&P (Signed)
History of Present Illness Carol Ruff MD; [id number] 11:22 AM) The patient is a 74 year old female who presents with fecal incontinence. 74 year old female who presents to the office for evaluation of fecal incontinence. This has been a problem for her for the past couple years. It is gradually gotten worse. She reports leakage on a daily basis. Sometimes she is changing her pads 7 or 8 times a day. She denies any incontinence to air. She does have incontinence to liquid stool. She reports soft, mushy bowel movements on a daily basis. She denies any real history of constipation. She denies any rectal bleeding. I saw her in November 2020 and we started daily fiber supplementation. She has been doing this at home for the past 2 months and continues to have leakage on a daily basis. She has completed a bowel diary. She is averaging ~1.3 episodes of leakage per day. Anal manometry was performed and showed decreased resting tone.    Problem List/Past Medical Carol Ruff, MD; [id number] 11:22 AM) INCONTINENCE OF FECES, UNSPECIFIED FECAL INCONTINENCE TYPE (R15.9)  Past Surgical History Carol Ruff, MD; Q000111Q 11:22 AM) Foot Surgery Bilateral. Knee Surgery Left. Shoulder Surgery Right. Spinal Surgery - Neck  Diagnostic Studies History Carol Ruff, MD; [id number] 11:22 AM) Colonoscopy 1-5 years ago Mammogram 1-3 years ago Pap Smear 1-5 years ago  Allergies Mammie Lorenzo, LPN; 075-GRM [id number] AM) No Known Drug Allergies [07/30/2019]:  Medication History Mammie Lorenzo, LPN; 075-GRM [id number] AM) Butalbital-APAP-Caffeine (50-325-40MG  Tablet, Oral) Active. OXcarbazepine (300MG  Tablet, Oral) Active. Vitamin D (Cholecalciferol) (10 MCG(400 UNIT) Capsule, Oral) Active. Medications Reconciled  Social History Carol Ruff, MD; Q000111Q 11:22 AM) Caffeine use Carbonated beverages. No alcohol use No drug use Tobacco use Never smoker.  Family History  Carol Ruff, MD; Q000111Q 11:22 AM) Alcohol Abuse Brother. Heart Disease Father. Respiratory Condition Mother. Seizure disorder Son.  Pregnancy / Birth History Carol Ruff, MD; Q000111Q 11:22 AM) Age at menarche 67 years. Age of menopause 64-50 Contraceptive History Contraceptive implant, Oral contraceptives. Gravida 2 Irregular periods Maternal age 42-25 Para 2  Other Problems Carol Ruff, MD; Q000111Q 11:22 AM) Bladder Problems Kidney Stone Migraine Headache     Review of Systems Carol Ruff MD; Q000111Q 11:22 AM) General Not Present- Appetite Loss, Chills, Fatigue, Fever, Night Sweats, Weight Gain and Weight Loss. Skin Not Present- Change in Wart/Mole, Dryness, Hives, Jaundice, New Lesions, Non-Healing Wounds, Rash and Ulcer. HEENT Present- Wears glasses/contact lenses. Not Present- Earache, Hearing Loss, Hoarseness, Nose Bleed, Oral Ulcers, Ringing in the Ears, Seasonal Allergies, Sinus Pain, Sore Throat, Visual Disturbances and Yellow Eyes. Respiratory Not Present- Bloody sputum, Chronic Cough, Difficulty Breathing, Snoring and Wheezing. Breast Not Present- Breast Mass, Breast Pain, Nipple Discharge and Skin Changes. Cardiovascular Not Present- Chest Pain, Difficulty Breathing Lying Down, Leg Cramps, Palpitations, Rapid Heart Rate, Shortness of Breath and Swelling of Extremities. Gastrointestinal Present- Change in Bowel Habits. Not Present- Abdominal Pain, Bloating, Bloody Stool, Chronic diarrhea, Constipation, Difficulty Swallowing, Excessive gas, Gets full quickly at meals, Hemorrhoids, Indigestion, Nausea, Rectal Pain and Vomiting. Female Genitourinary Present- Urgency. Not Present- Frequency, Nocturia, Painful Urination and Pelvic Pain. Musculoskeletal Not Present- Back Pain, Joint Pain, Joint Stiffness, Muscle Pain, Muscle Weakness and Swelling of Extremities. Psychiatric Not Present- Anxiety, Bipolar, Change in Sleep Pattern, Depression,  Fearful and Frequent crying. Endocrine Not Present- Cold Intolerance, Excessive Hunger, Hair Changes, Heat Intolerance, Hot flashes and New Diabetes. Hematology Not Present- Blood Thinners, Easy Bruising, Excessive bleeding, Gland problems, HIV and Persistent Infections.  Vitals Claiborne Billings  Dock[place] LPN; 075-GRM [id number] AM) 10/14/2019 10:41 AM Weight: 155.2 lb Height: 61in Body Surface Area: 1.7 m Body Mass Index: 29.32 kg/m  Temp.: 98.63F(Thermal Scan)  Pulse: 97 (Regular)  BP: 124/68 (Sitting, Left Arm, Standard)        Physical Exam Carol Ruff MD; Q000111Q 11:23 AM)  General Mental Status-Alert. General Appearance-Cooperative.  Abdomen Palpation/Percussion Palpation and Percussion of the abdomen reveal - Soft and Non Tender.    Assessment & Plan Carol Ruff MD; Q000111Q 10:56 AM)  INCONTINENCE OF FECES, UNSPECIFIED FECAL INCONTINENCE TYPE (R15.9) Impression: 74 year old female who presents to the office with ongoing issues with fecal incontinence. She has tried fiber supplementation and changing her eating habits to avoid leakage. She continues to have multiple episodes a week. On exam, via anal manometry, she has a slightly decreased resting tone. We discussed sacral nerve stimulator as a possibility to help decrease her accidents. I have given her literature on this to evaluate home. We discussed the risk and benefits of the procedure including failure of the device, infection and pain. I believe she understands this and wishes to proceed with test wire placement.

## 2019-10-15 ENCOUNTER — Encounter: Payer: Self-pay | Admitting: Neurology

## 2019-10-15 ENCOUNTER — Other Ambulatory Visit: Payer: Self-pay

## 2019-10-15 ENCOUNTER — Telehealth (INDEPENDENT_AMBULATORY_CARE_PROVIDER_SITE_OTHER): Payer: BC Managed Care – PPO | Admitting: Neurology

## 2019-10-15 VITALS — Ht 61.0 in | Wt 155.0 lb

## 2019-10-15 DIAGNOSIS — G40009 Localization-related (focal) (partial) idiopathic epilepsy and epileptic syndromes with seizures of localized onset, not intractable, without status epilepticus: Secondary | ICD-10-CM | POA: Diagnosis not present
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
## 2019-10-23 ENCOUNTER — Ambulatory Visit: Payer: BC Managed Care – PPO

## 2019-11-01 ENCOUNTER — Ambulatory Visit: Payer: BC Managed Care – PPO | Attending: Internal Medicine

## 2019-11-01 DIAGNOSIS — Z23 Encounter for immunization: Secondary | ICD-10-CM | POA: Insufficient documentation

## 2019-11-01 NOTE — Progress Notes (Signed)
   Covid-19 Vaccination Clinic  Name:  [name]    MRN: [id number] DOB: 01/27/1946  11/01/2019  [name] was observed post Covid-19 immunization for 15 minutes without incidence. She was provided with Vaccine Information Sheet and instruction to access the V-Safe system.   [name] was instructed to call 911 with any severe reactions post vaccine: Marland Kitchen Difficulty breathing  . Swelling of your face and throat  . A fast heartbeat  . A bad rash all over your body  . Dizziness and weakness    Immunizations Administered    Name Date Dose VIS Date Route   Pfizer COVID-19 Vaccine 11/01/2019 11:13 AM 0.3 mL 09/06/2019 Intramuscular   Manufacturer: [place]   Lot: [id number]   Green: [id number]

## 2019-11-13 ENCOUNTER — Ambulatory Visit: Payer: BC Managed Care – PPO

## 2019-11-14 ENCOUNTER — Encounter (HOSPITAL_BASED_OUTPATIENT_CLINIC_OR_DEPARTMENT_OTHER): Payer: Self-pay | Admitting: General Surgery

## 2019-11-14 ENCOUNTER — Other Ambulatory Visit: Payer: Self-pay

## 2019-11-14 NOTE — Progress Notes (Addendum)
Spoke w/ via phone for pre-op interview---Carol [name] needs dos----   bmet           Records in epic: ekg 01-28-2019 epic COVID test ------11-18-2019 Arrive at -------1100 11-21-2019 NPO after ------midnight Medications to take morning of surgery -----gabapentin, oxcarbenzapine lov neurology dr Delice Lesch 10-15-2019 last sieizure April and aug 2020 Diabetic medication -----n/a Patient Special Instructions ----- Pre-Op special Istructions ----- Patient verbalized understanding of instructions that were given at this phone interview. Patient denies shortness of breath, chest pain, fever, cough a this phone interview.

## 2019-11-18 ENCOUNTER — Other Ambulatory Visit (HOSPITAL_COMMUNITY)
Admission: RE | Admit: 2019-11-18 | Discharge: 2019-11-18 | Disposition: A | Discharge status: Home / Self Care | Payer: BC Managed Care – PPO | Source: Ambulatory Visit | Attending: General Surgery | Admitting: General Surgery

## 2019-11-18 DIAGNOSIS — Z20822 Contact with and (suspected) exposure to covid-19: Secondary | ICD-10-CM | POA: Insufficient documentation

## 2019-11-18 DIAGNOSIS — Z01812 Encounter for preprocedural laboratory examination: Secondary | ICD-10-CM | POA: Diagnosis present

## 2019-11-18 LAB — SARS CORONAVIRUS 2 (TAT 6-24 HRS): SARS Coronavirus 2: NEGATIVE

## 2019-11-21 ENCOUNTER — Ambulatory Visit (HOSPITAL_BASED_OUTPATIENT_CLINIC_OR_DEPARTMENT_OTHER): Payer: BC Managed Care – PPO | Admitting: Anesthesiology

## 2019-11-21 ENCOUNTER — Ambulatory Visit (HOSPITAL_COMMUNITY): Payer: BC Managed Care – PPO

## 2019-11-21 ENCOUNTER — Encounter (HOSPITAL_BASED_OUTPATIENT_CLINIC_OR_DEPARTMENT_OTHER): Payer: Self-pay | Admitting: General Surgery

## 2019-11-21 ENCOUNTER — Encounter (HOSPITAL_BASED_OUTPATIENT_CLINIC_OR_DEPARTMENT_OTHER)
Admission: RE | Disposition: A | Discharge status: Home / Self Care | Payer: Self-pay | Source: Home / Self Care | Attending: General Surgery

## 2019-11-21 ENCOUNTER — Ambulatory Visit (HOSPITAL_BASED_OUTPATIENT_CLINIC_OR_DEPARTMENT_OTHER)
Admission: RE | Admit: 2019-11-21 | Discharge: 2019-11-21 | Disposition: A | Discharge status: Home / Self Care | Payer: BC Managed Care – PPO | Attending: General Surgery | Admitting: General Surgery

## 2019-11-21 DIAGNOSIS — Z79899 Other long term (current) drug therapy: Secondary | ICD-10-CM | POA: Diagnosis not present

## 2019-11-21 DIAGNOSIS — G43909 Migraine, unspecified, not intractable, without status migrainosus: Secondary | ICD-10-CM | POA: Diagnosis not present

## 2019-11-21 DIAGNOSIS — R569 Unspecified convulsions: Secondary | ICD-10-CM | POA: Insufficient documentation

## 2019-11-21 DIAGNOSIS — Z8249 Family history of ischemic heart disease and other diseases of the circulatory system: Secondary | ICD-10-CM | POA: Insufficient documentation

## 2019-11-21 DIAGNOSIS — R159 Full incontinence of feces: Secondary | ICD-10-CM | POA: Insufficient documentation

## 2019-11-21 HISTORY — DX: Full incontinence of feces: R15.9

## 2019-11-21 HISTORY — DX: Personal history of urinary calculi: Z87.442

## 2019-11-21 HISTORY — DX: Generalized idiopathic epilepsy and epileptic syndromes, not intractable, without status epilepticus: G40.309

## 2019-11-21 LAB — BASIC METABOLIC PANEL
Anion gap: 13 (ref 5–15)
BUN: 14 mg/dL (ref 8–23)
CO2: 18 mmol/L — ABNORMAL LOW (ref 22–32)
Calcium: 9.4 mg/dL (ref 8.9–10.3)
Chloride: 107 mmol/L (ref 98–111)
Creatinine, Ser: 1.1 mg/dL — ABNORMAL HIGH (ref 0.44–1.00)
GFR calc Af Amer: 58 mL/min — ABNORMAL LOW (ref >60)
GFR calc non Af Amer: 50 mL/min — ABNORMAL LOW (ref >60)
Glucose, Bld: 102 mg/dL — ABNORMAL HIGH (ref 70–99)
Potassium: 4.8 mmol/L (ref 3.5–5.1)
Sodium: 138 mmol/L (ref 135–145)

## 2019-11-21 SURGERY — INSERTION, NEUROSTIMULATOR, SACRAL
Anesthesia: Monitor Anesthesia Care | Site: Back

## 2019-11-21 MED ORDER — PROPOFOL 500 MG/50ML IV EMUL
INTRAVENOUS | Status: DC | PRN
  Administered 2019-11-21: 150 ug/kg/min via INTRAVENOUS

## 2019-11-21 MED ORDER — ONDANSETRON HCL 4 MG/2ML IJ SOLN
4.0000 mg | Freq: Once | INTRAMUSCULAR | Status: DC | PRN
  Filled 2019-11-21: qty 2

## 2019-11-21 MED ORDER — PROPOFOL 500 MG/50ML IV EMUL
INTRAVENOUS | Status: AC
  Filled 2019-11-21: qty 50

## 2019-11-21 MED ORDER — CEFAZOLIN SODIUM-DEXTROSE 2-4 GM/100ML-% IV SOLN
2.0000 g | INTRAVENOUS | Status: AC
  Administered 2019-11-21: 2 g via INTRAVENOUS
  Filled 2019-11-21: qty 100

## 2019-11-21 MED ORDER — PROPOFOL 500 MG/50ML IV EMUL: INTRAVENOUS | Status: DC | PRN

## 2019-11-21 MED ORDER — OXYCODONE HCL 5 MG/5ML PO SOLN
5.0000 mg | Freq: Once | ORAL | Status: DC | PRN
  Filled 2019-11-21: qty 5

## 2019-11-21 MED ORDER — SODIUM CHLORIDE 0.9 % IV SOLN
INTRAVENOUS | Status: DC | PRN
  Administered 2019-11-21: 13:00:00 10 ug/kg/min via INTRAVENOUS

## 2019-11-21 MED ORDER — ACETAMINOPHEN 160 MG/5ML PO SOLN
325.0000 mg | ORAL | Status: DC | PRN
  Filled 2019-11-21: qty 20.3

## 2019-11-21 MED ORDER — SODIUM CHLORIDE 0.9% FLUSH
3.0000 mL | Freq: Two times a day (BID) | INTRAVENOUS | Status: DC
  Filled 2019-11-21: qty 3

## 2019-11-21 MED ORDER — KETOROLAC TROMETHAMINE 30 MG/ML IJ SOLN
INTRAMUSCULAR | Status: DC | PRN
  Administered 2019-11-21: 30 mg via INTRAVENOUS

## 2019-11-21 MED ORDER — ACETAMINOPHEN 650 MG RE SUPP
650.0000 mg | RECTAL | Status: DC | PRN
  Filled 2019-11-21: qty 1

## 2019-11-21 MED ORDER — ACETAMINOPHEN 325 MG PO TABS
650.0000 mg | ORAL_TABLET | ORAL | Status: DC | PRN
  Filled 2019-11-21: qty 2

## 2019-11-21 MED ORDER — FENTANYL CITRATE (PF) 100 MCG/2ML IJ SOLN
25.0000 ug | INTRAMUSCULAR | Status: DC | PRN
  Filled 2019-11-21: qty 1

## 2019-11-21 MED ORDER — KETAMINE HCL 10 MG/ML IJ SOLN: INTRAMUSCULAR | Status: DC | PRN

## 2019-11-21 MED ORDER — ACETAMINOPHEN 500 MG PO TABS
1000.0000 mg | ORAL_TABLET | ORAL | Status: AC
  Administered 2019-11-21: 12:00:00 1000 mg via ORAL
  Filled 2019-11-21: qty 2

## 2019-11-21 MED ORDER — ACETAMINOPHEN 500 MG PO TABS
ORAL_TABLET | ORAL | Status: AC
  Filled 2019-11-21: qty 2

## 2019-11-21 MED ORDER — BUPIVACAINE-EPINEPHRINE 0.5% -1:200000 IJ SOLN
INTRAMUSCULAR | Status: DC | PRN
  Administered 2019-11-21: 20 mL

## 2019-11-21 MED ORDER — FENTANYL CITRATE (PF) 100 MCG/2ML IJ SOLN
INTRAMUSCULAR | Status: AC
  Filled 2019-11-21: qty 2

## 2019-11-21 MED ORDER — ONDANSETRON HCL 4 MG/2ML IJ SOLN
INTRAMUSCULAR | Status: DC | PRN
  Administered 2019-11-21: 4 mg via INTRAVENOUS

## 2019-11-21 MED ORDER — SODIUM CHLORIDE 0.9% FLUSH
3.0000 mL | INTRAVENOUS | Status: DC | PRN
  Filled 2019-11-21: qty 3

## 2019-11-21 MED ORDER — OXYCODONE HCL 5 MG PO TABS
5.0000 mg | ORAL_TABLET | Freq: Once | ORAL | Status: DC | PRN
  Filled 2019-11-21: qty 1

## 2019-11-21 MED ORDER — SODIUM CHLORIDE 0.9 % IV SOLN
250.0000 mL | INTRAVENOUS | Status: DC | PRN
  Filled 2019-11-21: qty 250

## 2019-11-21 MED ORDER — CEFAZOLIN SODIUM-DEXTROSE 2-4 GM/100ML-% IV SOLN
INTRAVENOUS | Status: AC
  Filled 2019-11-21: qty 100

## 2019-11-21 MED ORDER — LACTATED RINGERS IV SOLN
INTRAVENOUS | Status: DC
  Filled 2019-11-21 (×2): qty 1000

## 2019-11-21 MED ORDER — MIDAZOLAM HCL 2 MG/2ML IJ SOLN
INTRAMUSCULAR | Status: DC | PRN
  Administered 2019-11-21: 1 mg via INTRAVENOUS

## 2019-11-21 MED ORDER — KETAMINE HCL 10 MG/ML IJ SOLN
INTRAMUSCULAR | Status: AC
  Filled 2019-11-21: qty 1

## 2019-11-21 MED ORDER — MIDAZOLAM HCL 2 MG/2ML IJ SOLN
INTRAMUSCULAR | Status: AC
  Filled 2019-11-21: qty 2

## 2019-11-21 MED ORDER — ACETAMINOPHEN 325 MG PO TABS
325.0000 mg | ORAL_TABLET | ORAL | Status: DC | PRN
  Filled 2019-11-21: qty 2

## 2019-11-21 MED ORDER — FENTANYL CITRATE (PF) 100 MCG/2ML IJ SOLN
INTRAMUSCULAR | Status: DC | PRN
  Administered 2019-11-21: 25 ug via INTRAVENOUS

## 2019-11-21 SURGICAL SUPPLY — 57 items
BAG URINE LEG 500ML (DRAIN) IMPLANT
BLADE HEX COATED 2.75 (ELECTRODE) ×2 IMPLANT
BLADE SURG 15 STRL LF DISP TIS (BLADE) ×1 IMPLANT
BLADE SURG 15 STRL SS (BLADE) ×1
CABLE EXTEN 4.32 INTERSTIM (NEUROSURGERY SUPPLIES) ×2 IMPLANT
CABLE TEST STIMULATION (UROLOGICAL SUPPLIES) IMPLANT
CABLE TWIST LOCK 25CM (UROLOGICAL SUPPLIES) IMPLANT
CHLORAPREP W/TINT 26 (MISCELLANEOUS) ×2 IMPLANT
COVER BACK TABLE 60X90IN (DRAPES) ×2 IMPLANT
COVER MAYO STAND STRL (DRAPES) ×2 IMPLANT
COVER PROBE W GEL 5X96 (DRAPES) IMPLANT
COVER WAND RF STERILE (DRAPES) ×4 IMPLANT
DERMABOND ADVANCED (GAUZE/BANDAGES/DRESSINGS) ×1
DERMABOND ADVANCED .7 DNX12 (GAUZE/BANDAGES/DRESSINGS) ×1 IMPLANT
DRAPE C-ARM 42X72 X-RAY (DRAPES) IMPLANT
DRAPE INCISE IOBAN 66X45 STRL (DRAPES) ×2 IMPLANT
DRAPE LAPAROSCOPIC ABDOMINAL (DRAPES) ×2 IMPLANT
DRAPE SHEET LG 3/4 BI-LAMINATE (DRAPES) IMPLANT
DRAPE UTILITY XL STRL (DRAPES) ×2 IMPLANT
DRSG TEGADERM 4X4.75 (GAUZE/BANDAGES/DRESSINGS) ×2 IMPLANT
DRSG TELFA 3X8 NADH (GAUZE/BANDAGES/DRESSINGS) ×2 IMPLANT
ELECT REM PT RETURN 9FT ADLT (ELECTROSURGICAL) ×2
ELECTRODE REM PT RTRN 9FT ADLT (ELECTROSURGICAL) ×1 IMPLANT
External Neurostimulator ×2 IMPLANT
GAUZE SPONGE 4X4 12PLY STRL (GAUZE/BANDAGES/DRESSINGS) IMPLANT
GAUZE SPONGE 4X4 12PLY STRL LF (GAUZE/BANDAGES/DRESSINGS) ×2 IMPLANT
GLOVE BIO SURGEON STRL SZ 6.5 (GLOVE) ×4 IMPLANT
GLOVE BIOGEL PI IND STRL 6.5 (GLOVE) ×1 IMPLANT
GLOVE BIOGEL PI IND STRL 7.0 (GLOVE) ×1 IMPLANT
GLOVE BIOGEL PI INDICATOR 6.5 (GLOVE) ×1
GLOVE BIOGEL PI INDICATOR 7.0 (GLOVE) ×1
GOWN STRL REUS W/TWL LRG LVL3 (GOWN DISPOSABLE) ×2 IMPLANT
GOWN STRL REUS W/TWL XL LVL3 (GOWN DISPOSABLE) ×2 IMPLANT
INTRODUCER GUIDE DILATR SHEATH (SET/KITS/TRAYS/PACK) IMPLANT
InterStim Percutaneous Extension ×2 IMPLANT
KIT TURNOVER CYSTO (KITS) ×2 IMPLANT
LEAD INTERSTIM 4.32 28 L (Lead) ×2 IMPLANT
NEEDLE HYPO 22GX1.5 SAFETY (NEEDLE) ×2 IMPLANT
NEUROSTIMULATOR 1.7X2X.06 (UROLOGICAL SUPPLIES) ×2 IMPLANT
PACK BASIN DAY SURGERY FS (CUSTOM PROCEDURE TRAY) ×2 IMPLANT
PAD ARMBOARD 7.5X6 YLW CONV (MISCELLANEOUS) IMPLANT
PENCIL BUTTON HOLSTER BLD 10FT (ELECTRODE) ×2 IMPLANT
PROGRAMMER SMART TH90G01 (UROLOGICAL SUPPLIES) IMPLANT
STRIP CLOSURE SKIN 1/2X4 (GAUZE/BANDAGES/DRESSINGS) IMPLANT
SUT ETHILON 2 0 PS N (SUTURE) ×2 IMPLANT
SUT SILK 2 0 (SUTURE) ×1
SUT SILK 2 0 TIES 17X18 (SUTURE)
SUT SILK 2-0 18XBRD TIE 12 (SUTURE) ×1 IMPLANT
SUT SILK 2-0 18XBRD TIE BLK (SUTURE) IMPLANT
SUT VIC AB 3-0 SH 27 (SUTURE) ×1
SUT VIC AB 3-0 SH 27X BRD (SUTURE) ×1 IMPLANT
SUT VICRYL 4-0 PS2 18IN ABS (SUTURE) ×2 IMPLANT
SYR BULB IRRIGATION 50ML (SYRINGE) ×2 IMPLANT
SYR CONTROL 10ML LL (SYRINGE) ×2 IMPLANT
TOWEL OR 17X26 10 PK STRL BLUE (TOWEL DISPOSABLE) ×2 IMPLANT
TUBE CONNECTING 12X1/4 (SUCTIONS) IMPLANT
WATER STERILE IRR 500ML POUR (IV SOLUTION) ×2 IMPLANT

## 2019-11-21 NOTE — Discharge Instructions (Addendum)
POST OP INSTRUCTIONS  Always review your discharge instruction sheet given to you by the facility where your surgery was performed.   1. You make take acetaminophen (Tylenol) or ibuprofen (Advil) as needed.  2. Take your usually prescribed medications unless otherwise directed. 3. You should follow a light diet for the remainder of the day after your procedure. 4. Most patients will experience some mild swelling and/or bruising in the area of the incision. It may take several days to resolve. 5. It is common to experience some constipation if taking pain medication after surgery. Increasing fluid intake and taking a stool softener (such as Colace) will usually help or prevent this problem from occurring. A mild laxative (Milk of Magnesia or Miralax) should be taken according to package directions if there are no bowel movements after 48 hours.  6. Do not remove your bandages.  Do not get these wet.  If the tape comes loose, re-tape the area with medical grade tape.   7. ACTIVITIES:  Limit activity involving your arms for the next 72 hours. Do no strenuous exercise or activity for 1 week. You may drive when you are no longer taking prescription pain medication, you can comfortably wear a seatbelt, and you can maneuver your car. 10.You may need to see your doctor in the office for a follow-up appointment.  Please       check with your doctor.    WHEN TO CALL YOUR DOCTOR [phone]): 1. Fever over 101.0 2. Chills 3. Continued bleeding from incision 4. Increased redness and tenderness at the site 5. Shortness of breath, difficulty breathing   The clinic staff is available to answer your questions during regular business hours. Please don't hesitate to call and ask to speak to one of the nurses or medical assistants for clinical concerns. If you have a medical emergency, go to the nearest emergency room or call 911.  A surgeon from [facility] is always on call at the hospital.      For further information, please visit [email or web address]   Post Anesthesia Home Care Instructions  Activity: Get plenty of rest for the remainder of the day. A responsible individual must stay with you for 24 hours following the procedure.  For the next 24 hours, DO NOT: -Drive a car -Paediatric nurse -Drink alcoholic beverages -Take any medication unless instructed by your physician -Make any legal decisions or sign important papers.  Meals: Start with liquid foods such as gelatin or soup. Progress to regular foods as tolerated. Avoid greasy, spicy, heavy foods. If nausea and/or vomiting occur, drink only clear liquids until the nausea and/or vomiting subsides. Call your physician if vomiting continues.  Special Instructions/Symptoms: Your throat may feel dry or sore from the anesthesia or the breathing tube placed in your throat during surgery. If this causes discomfort, gargle with warm salt water. The discomfort should disappear within 24 hours.  If you had a scopolamine patch placed behind your ear for the management of post- operative nausea and/or vomiting:  1. The medication in the patch is effective for 72 hours, after which it should be removed.  Wrap patch in a tissue and discard in the trash. Wash hands thoroughly with soap and water. 2. You may remove the patch earlier than 72 hours if you experience unpleasant side effects which may include dry mouth, dizziness or visual disturbances. 3. Avoid touching the patch. Wash your hands with soap and water after contact with the patch.

## 2019-11-21 NOTE — Op Note (Signed)
11/21/2019  1:56 PM  PATIENT:  Carol [name]  74 y.o. female  Patient Care Team: Inda Coke, [place] as PCP - General (Physician Assistant) Delice Lesch Lezlie Octave, MD as Consulting Physician (Neurology)  PRE-OPERATIVE DIAGNOSIS:  FECAL INCONTINENCE  POST-OPERATIVE DIAGNOSIS:  FECAL INCONTINENCE  PROCEDURE:  SACRAL NERVE STIMULATOR IMPLANTATION TEST PHASE I   Surgeon(s): Leighton Ruff, MD  ASSISTANT: none   ANESTHESIA:   local and MAC  EBL: 79m Total I/O In: 100 [IV Piggyback:100] Out: -   DRAINS: none   SPECIMEN:  No Specimen  DISPOSITION OF SPECIMEN:  N/A  COUNTS:  YES  PLAN OF CARE: Discharge to home after PACU  PATIENT DISPOSITION:  PACU - hemodynamically stable.  INDICATION: Fecal incontinence refractory to medical treatment.  The patient suffers from fecal incontinence ~ 1.3 per day.  she has tried fiber supplements to manage this without success.  Anal manometry shows decreased resting tone.   The risks and benefits of the surgery were described to the patient and consent was signed and placed on chart prior to the OR.  DESCRIPTION: the patient was identified in the preoperative holding area and taken to the OR where they were laid prone on the operating room table.  MAC anesthesia was induced without difficulty. SCDs were also noted to be in place prior to the initiation of anesthesia.  Pillows were placed under lower abdomen to flatten the sacrum and under shins to allow the toes to dangle freely. A ground pad was placed on the bottom of the patient's foot and the proximal ends of the j-hook patient cable were connected to the ground pad and the external neurostimulator (ENS).The patient was then prepped and draped in the usual sterile fashion.  A surgical timeout was performed indicating the correct patient, procedure, positioning and need for preoperative antibiotics.  The c-arm was moved into AP position to provide fluoroscopic guidance of the sacrum. The medial  edges of the foramina were identified and marked. The c-arm was then moved into the lateral position to identify the S3 foramen. Once the needle entry point was determined, local injection of was administered on the patient's L side. A foramen needle was placed in the superior, medial aspect of the S4 foreman and another foramen needle was used to place in the S3 foramen and appropriate needle depth was visualized utilizing fluoroscopy. Proper S3 needle location was also confirmed by direct observation of the lifting of the perineum or "bellowing," and plantar flexion of the great toe utilizing the j-hook patient cable, the external neurostimulator and Verify controller. The foramen needle stylet was removed and a directional guide was placed through the needle using markers on the guide to assure appropriate depth. The foramen needle was removed by sliding over the directional guide. A small incision was made peripherally to the directional guide through the skin. The lead introducer with dilator was placed over the directional guide and utilizing fluoroscopic guidance, the lead introducer was advanced until the radiopaque mark was half-way through the foramen. The dilator was removed along with the directional guide. Using fluoroscopy, the tined lead with a curved stylet was placed through the introducer until electrodes two and three straddled the anterior surface of the sacrum. All four electrodes were tested, observing "bellows" and plantar flexion of the great toe utilizing the j-hook patient cable and the external neurostimulator with Verify controller. After satisfactory lead positioning was confirmed, the introducer was retracted over the lead under continuous fluoroscopy, deploying the tines into presacral tissue.  Retesting of all four electrodes confirming appropriate responses was completed.   The potential internal neurostimulator pocket site was identified below the iliac crest and lateral to the  sacrum. Local anesthesia was administered and an incision was made into the subcutaneous tissue creating a connection site. Blunt dissection was used to create a small pocket with hemostasis achieved.  A tunneling tool with sheath was placed from the lead exit site subcutaneously to the small incised pocket site. The tunneling tool was removed and the lead was fed through the sheath, exiting at the pocket connection site. The sheath was removed. The lead was cleaned and dried. A protective boot was placed over the lead; the lead was inserted into the temporary percutaneous extension with visual confirmation of blue tip advancement. The setscrew was tightened with the torque wrench until audible clicks were heard. Using the tunneling tool and sheath, a subcutaneous tunnel was created from the pocket site to the contralateral buttock and exited at a localized site. The percutaneous extension was placed through the sheath, the sheath removed, and the extension exiting the site. A silk tie was used to suture the wire so it would not migrate into the tunnel.  The connection components were placed into the incision on the left hip. The incisions were closed with 3-0 Vicryl subcuticular sutures and a 4-0 Vicryl skin suture. The incisions were closed with Dermabond.  Adhesive strips and gauze were placed over the percutaneous extension exit site and secured with transparent dressing.   The percutaneous extension was attached to the external white twist lock cable. Gauze was placed under the twist-lock connection with cable strain relief and secured using transparent dressing. The twist lock cable was then plugged into the external neuorstimulator. The patient was transferred to PACU in satisfactory condition. Using the Verify controller and external neurostimulator, the patient was programmed to the electrode of optimum sensation and provided utilization instructions prior to discharge. Patient will complete a voiding  diary during testing period to help document results of this test procedure.

## 2019-11-21 NOTE — Anesthesia Preprocedure Evaluation (Addendum)
Anesthesia Evaluation  Patient identified by MRN, date of birth, ID band Patient awake    Reviewed: Allergy & Precautions, NPO status , Patient's Chart, lab work & pertinent test results  Airway Mallampati: I  TM Distance: >3 FB Neck ROM: Full    Dental no notable dental hx. (+) Teeth Intact, Dental Advisory Given   Pulmonary neg pulmonary ROS,    Pulmonary exam normal        Cardiovascular negative cardio ROS Normal cardiovascular exam Rhythm:Regular Rate:Normal     Neuro/Psych Seizures -, Well Controlled,  negative psych ROS   GI/Hepatic negative GI ROS, Neg liver ROS,   Endo/Other  negative endocrine ROS  Renal/GU negative Renal ROS  negative genitourinary   Musculoskeletal negative musculoskeletal ROS (+)   Abdominal Normal abdominal exam  (+)   Peds negative pediatric ROS (+)  Hematology negative hematology ROS (+)   Anesthesia Other Findings   Reproductive/Obstetrics negative OB ROS                            Anesthesia Physical Anesthesia Plan  ASA: II  Anesthesia Plan: MAC   Post-op Pain Management:    Induction:   PONV Risk Score and Plan: 2 and Ondansetron  Airway Management Planned: Natural Airway and Nasal Cannula  Additional Equipment: None  Intra-op Plan:   Post-operative Plan:   Informed Consent: I have reviewed the patients History and Physical, chart, labs and discussed the procedure including the risks, benefits and alternatives for the proposed anesthesia with the patient or authorized representative who has indicated his/her understanding and acceptance.       Plan Discussed with: CRNA and Anesthesiologist  Anesthesia Plan Comments:        Anesthesia Quick Evaluation

## 2019-11-21 NOTE — Anesthesia Procedure Notes (Signed)
Procedure Name: MAC Date/Time: 11/21/2019 12:55 PM Performed by: Wanita Chamberlain, CRNA Pre-anesthesia Checklist: Patient identified, Timeout performed, Emergency Drugs available and Suction available Patient Re-evaluated:Patient Re-evaluated prior to induction Oxygen Delivery Method: Nasal cannula Induction Type: IV induction Placement Confirmation: positive ETCO2 and CO2 detector Dental Injury: Teeth and Oropharynx as per pre-operative assessment

## 2019-11-21 NOTE — H&P (View-Only) (Signed)
History of Present Illness  The patient is a 74 year old female who presents with fecal incontinence. 74 year old female who presents to the office for evaluation of fecal incontinence. This has been a problem for her for the past couple years. It is gradually gotten worse. She reports leakage on a daily basis. Sometimes she is changing her pads 7 or 8 times a day. She denies any incontinence to air. She does have incontinence to liquid stool. She reports soft, mushy bowel movements on a daily basis. She denies any real history of constipation. She denies any rectal bleeding. I saw her in November 2020 and we started daily fiber supplementation. She has been doing this at home for the past 2 months and continues to have leakage on a daily basis. She has completed a bowel diary. She is averaging ~1.3 episodes of leakage per day. Anal manometry was performed and showed decreased resting tone.    Problem List/Past Medical  INCONTINENCE OF FECES, UNSPECIFIED FECAL INCONTINENCE TYPE (R15.9)   Past Surgical History  Foot Surgery  Bilateral. Knee Surgery  Left. Shoulder Surgery  Right. Spinal Surgery - Neck   Diagnostic Studies History  Colonoscopy  1-5 years ago Mammogram  1-3 years ago Pap Smear  1-5 years ago  Allergies  No Known Drug Allergies [07/30/2019]:  Medication History  Butalbital-APAP-Caffeine (50-325-40MG  Tablet, Oral) Active. OXcarbazepine (300MG  Tablet, Oral) Active. Vitamin D (Cholecalciferol) (10 MCG(400 UNIT) Capsule, Oral) Active. Medications Reconciled  Social History  Caffeine use  Carbonated beverages. No alcohol use  No drug use  Tobacco use  Never smoker.  Family History  Alcohol Abuse  Brother. Heart Disease  Father. Respiratory Condition  Mother. Seizure disorder  Son.  Pregnancy / Birth History  Age at menarche  19 years. Age of menopause  53-50 Contraceptive History  Contraceptive implant, Oral  contraceptives. Gravida  2 Irregular periods  Maternal age  64-25 Para  2  Other Problems Bladder Problems  Kidney Stone  Migraine Headache     Review of Systems  General Not Present- Appetite Loss, Chills, Fatigue, Fever, Night Sweats, Weight Gain and Weight Loss. Skin Not Present- Change in Wart/Mole, Dryness, Hives, Jaundice, New Lesions, Non-Healing Wounds, Rash and Ulcer. HEENT Present- Wears glasses/contact lenses. Not Present- Earache, Hearing Loss, Hoarseness, Nose Bleed, Oral Ulcers, Ringing in the Ears, Seasonal Allergies, Sinus Pain, Sore Throat, Visual Disturbances and Yellow Eyes. Respiratory Not Present- Bloody sputum, Chronic Cough, Difficulty Breathing, Snoring and Wheezing. Breast Not Present- Breast Mass, Breast Pain, Nipple Discharge and Skin Changes. Cardiovascular Not Present- Chest Pain, Difficulty Breathing Lying Down, Leg Cramps, Palpitations, Rapid Heart Rate, Shortness of Breath and Swelling of Extremities. Gastrointestinal Present- Change in Bowel Habits. Not Present- Abdominal Pain, Bloating, Bloody Stool, Chronic diarrhea, Constipation, Difficulty Swallowing, Excessive gas, Gets full quickly at meals, Hemorrhoids, Indigestion, Nausea, Rectal Pain and Vomiting. Female Genitourinary Present- Urgency. Not Present- Frequency, Nocturia, Painful Urination and Pelvic Pain. Musculoskeletal Not Present- Back Pain, Joint Pain, Joint Stiffness, Muscle Pain, Muscle Weakness and Swelling of Extremities. Psychiatric Not Present- Anxiety, Bipolar, Change in Sleep Pattern, Depression, Fearful and Frequent crying. Endocrine Not Present- Cold Intolerance, Excessive Hunger, Hair Changes, Heat Intolerance, Hot flashes and New Diabetes. Hematology Not Present- Blood Thinners, Easy Bruising, Excessive bleeding, Gland problems, HIV and Persistent Infections.  BP 123/77   Pulse 79   Temp 97.8 F (36.6 C) (Oral)   Resp 20   Ht 5' 1.5" (1.562 m)   Wt 68.6 kg   SpO2  98%    BMI 28.11 kg/m    Physical Exam  General Mental Status-Alert. General Appearance-Cooperative. CV: RRR Lungs: CTA Abdomen Palpation/Percussion Palpation and Percussion of the abdomen reveal - Soft and Non Tender.    Assessment & Plan  INCONTINENCE OF FECES, UNSPECIFIED FECAL INCONTINENCE TYPE (R15.9) Impression: 74 year old female who presents to the office with ongoing issues with fecal incontinence. She has tried fiber supplementation and changing her eating habits to avoid leakage. She continues to have multiple episodes a week. On exam, via anal manometry, she has a slightly decreased resting tone. We discussed sacral nerve stimulator as a possibility to help decrease her accidents. I have given her literature on this to evaluate home. We discussed the risk and benefits of the procedure including failure of the device, infection and pain. I believe she understands this and wishes to proceed with test wire placement.

## 2019-11-21 NOTE — Anesthesia Postprocedure Evaluation (Signed)
Anesthesia Post Note  Patient: Carol [name]  Procedure(s) Performed: SACRAL NERVE STIMULATOR IMPLANTATION TEST PHASE I (N/A Back)     Patient location during evaluation: Phase II Anesthesia Type: MAC Level of consciousness: awake Pain management: pain level controlled Vital Signs Assessment: post-procedure vital signs reviewed and stable Respiratory status: spontaneous breathing Cardiovascular status: stable Postop Assessment: no apparent nausea or vomiting Anesthetic complications: no    Last Vitals:  Vitals:   11/21/19 1136 11/21/19 1404  BP: 123/77 113/60  Pulse: 79 74  Resp: 20 11  Temp: 36.6 C (!) 36.3 C  SpO2: 98% 97%    Last Pain:  Vitals:   11/21/19 1445  TempSrc:   PainSc: 0-No pain   Pain Goal: Patients Stated Pain Goal: 4 (11/21/19 1404)                 Huston Foley

## 2019-11-21 NOTE — Transfer of Care (Signed)
Immediate Anesthesia Transfer of Care Note  Patient: TYAUNA LACAZE  Procedure(s) Performed: SACRAL NERVE STIMULATOR IMPLANTATION TEST PHASE I (N/A Back)  Patient Location: PACU  Anesthesia Type:MAC  Level of Consciousness: awake, alert , oriented and patient cooperative  Airway & Oxygen Therapy: Patient Spontanous Breathing and Patient connected to nasal cannula oxygen  Post-op Assessment: Report given to RN and Post -op Vital signs reviewed and stable  Post vital signs: Reviewed and stable  Last Vitals:  Vitals Value Taken Time  BP    Temp    Pulse 76 11/21/19 1404  Resp 17 11/21/19 1404  SpO2 97 % 11/21/19 1404  Vitals shown include unvalidated device data.  Last Pain:  Vitals:   11/21/19 1136  TempSrc: Oral  PainSc: 4       Patients Stated Pain Goal: 4 (13/64/38 3779)  Complications: No apparent anesthesia complications

## 2019-11-21 NOTE — H&P (Signed)
History of Present Illness  The patient is a 74 year old female who presents with fecal incontinence. 74 year old female who presents to the office for evaluation of fecal incontinence. This has been a problem for her for the past couple years. It is gradually gotten worse. She reports leakage on a daily basis. Sometimes she is changing her pads 7 or 8 times a day. She denies any incontinence to air. She does have incontinence to liquid stool. She reports soft, mushy bowel movements on a daily basis. She denies any real history of constipation. She denies any rectal bleeding. I saw her in November 2020 and we started daily fiber supplementation. She has been doing this at home for the past 2 months and continues to have leakage on a daily basis. She has completed a bowel diary. She is averaging ~1.3 episodes of leakage per day. Anal manometry was performed and showed decreased resting tone.    Problem List/Past Medical  INCONTINENCE OF FECES, UNSPECIFIED FECAL INCONTINENCE TYPE (R15.9)   Past Surgical History  Foot Surgery  Bilateral. Knee Surgery  Left. Shoulder Surgery  Right. Spinal Surgery - Neck   Diagnostic Studies History  Colonoscopy  1-5 years ago Mammogram  1-3 years ago Pap Smear  1-5 years ago  Allergies  No Known Drug Allergies [07/30/2019]:  Medication History  Butalbital-APAP-Caffeine (50-325-40MG  Tablet, Oral) Active. OXcarbazepine (300MG  Tablet, Oral) Active. Vitamin D (Cholecalciferol) (10 MCG(400 UNIT) Capsule, Oral) Active. Medications Reconciled  Social History  Caffeine use  Carbonated beverages. No alcohol use  No drug use  Tobacco use  Never smoker.  Family History  Alcohol Abuse  Brother. Heart Disease  Father. Respiratory Condition  Mother. Seizure disorder  Son.  Pregnancy / Birth History  Age at menarche  59 years. Age of menopause  46-50 Contraceptive History  Contraceptive implant, Oral  contraceptives. Gravida  2 Irregular periods  Maternal age  65-25 Para  2  Other Problems Bladder Problems  Kidney Stone  Migraine Headache     Review of Systems  General Not Present- Appetite Loss, Chills, Fatigue, Fever, Night Sweats, Weight Gain and Weight Loss. Skin Not Present- Change in Wart/Mole, Dryness, Hives, Jaundice, New Lesions, Non-Healing Wounds, Rash and Ulcer. HEENT Present- Wears glasses/contact lenses. Not Present- Earache, Hearing Loss, Hoarseness, Nose Bleed, Oral Ulcers, Ringing in the Ears, Seasonal Allergies, Sinus Pain, Sore Throat, Visual Disturbances and Yellow Eyes. Respiratory Not Present- Bloody sputum, Chronic Cough, Difficulty Breathing, Snoring and Wheezing. Breast Not Present- Breast Mass, Breast Pain, Nipple Discharge and Skin Changes. Cardiovascular Not Present- Chest Pain, Difficulty Breathing Lying Down, Leg Cramps, Palpitations, Rapid Heart Rate, Shortness of Breath and Swelling of Extremities. Gastrointestinal Present- Change in Bowel Habits. Not Present- Abdominal Pain, Bloating, Bloody Stool, Chronic diarrhea, Constipation, Difficulty Swallowing, Excessive gas, Gets full quickly at meals, Hemorrhoids, Indigestion, Nausea, Rectal Pain and Vomiting. Female Genitourinary Present- Urgency. Not Present- Frequency, Nocturia, Painful Urination and Pelvic Pain. Musculoskeletal Not Present- Back Pain, Joint Pain, Joint Stiffness, Muscle Pain, Muscle Weakness and Swelling of Extremities. Psychiatric Not Present- Anxiety, Bipolar, Change in Sleep Pattern, Depression, Fearful and Frequent crying. Endocrine Not Present- Cold Intolerance, Excessive Hunger, Hair Changes, Heat Intolerance, Hot flashes and New Diabetes. Hematology Not Present- Blood Thinners, Easy Bruising, Excessive bleeding, Gland problems, HIV and Persistent Infections.  BP 123/77   Pulse 79   Temp 97.8 F (36.6 C) (Oral)   Resp 20   Ht 5' 1.5" (1.562 m)   Wt 68.6 kg   SpO2  98%    BMI 28.11 kg/m    Physical Exam  General Mental Status-Alert. General Appearance-Cooperative. CV: RRR Lungs: CTA Abdomen Palpation/Percussion Palpation and Percussion of the abdomen reveal - Soft and Non Tender.    Assessment & Plan  INCONTINENCE OF FECES, UNSPECIFIED FECAL INCONTINENCE TYPE (R15.9) Impression: 74 year old female who presents to the office with ongoing issues with fecal incontinence. She has tried fiber supplementation and changing her eating habits to avoid leakage. She continues to have multiple episodes a week. On exam, via anal manometry, she has a slightly decreased resting tone. We discussed sacral nerve stimulator as a possibility to help decrease her accidents. I have given her literature on this to evaluate home. We discussed the risk and benefits of the procedure including failure of the device, infection and pain. I believe she understands this and wishes to proceed with test wire placement.

## 2019-11-25 ENCOUNTER — Encounter (HOSPITAL_BASED_OUTPATIENT_CLINIC_OR_DEPARTMENT_OTHER): Payer: Self-pay | Admitting: General Surgery

## 2019-11-25 ENCOUNTER — Other Ambulatory Visit: Payer: Self-pay

## 2019-11-25 NOTE — Progress Notes (Addendum)
ADDENDUM:  Pt surgery time has change.  Called and spoke w/ pt , she verbalized understanding to arrive at [facility].   Spoke w/ via phone for pre-op interview--- PT Lab needs dos----  none             Lab results------ no COVID test ------ 11-26-2019 @ 1450 Arrive at ------- 0800 NPO after ------ [place] Medications to take morning of surgery ----- Gabapentin and Triplepta am w/ sips of water Diabetic medication ----- n/a Patient Special Instructions ----- n/a Pre-Op special Istructions ----- pre-op orders pending , sent inbox message to Carol Marcello Moores Patient verbalized understanding of instructions that were given at this phone interview. Patient denies shortness of breath, chest pain, fever, cough a this phone interview.  Anesthesia :  Hx generalized seizures  PCP: Carol Coke PA Neurologist:  Carol [name] (lov 10-15-2019 epic) Chest x-ray : no EKG : 01-28-2019 epic Echo : no Cardiac Cath :  no Sleep Study/ CPAP : NO  Blood Thinner/ Instructions /Last Dose: NO ASA / Instructions/ Last Dose : NO

## 2019-11-26 ENCOUNTER — Ambulatory Visit: Payer: BC Managed Care – PPO | Attending: Internal Medicine

## 2019-11-26 ENCOUNTER — Other Ambulatory Visit (HOSPITAL_COMMUNITY)
Admission: RE | Admit: 2019-11-26 | Discharge: 2019-11-26 | Disposition: A | Discharge status: Home / Self Care | Payer: BC Managed Care – PPO | Source: Ambulatory Visit | Attending: General Surgery | Admitting: General Surgery

## 2019-11-26 ENCOUNTER — Other Ambulatory Visit (HOSPITAL_COMMUNITY): Payer: BC Managed Care – PPO

## 2019-11-26 DIAGNOSIS — Z23 Encounter for immunization: Secondary | ICD-10-CM | POA: Insufficient documentation

## 2019-11-26 DIAGNOSIS — Z20822 Contact with and (suspected) exposure to covid-19: Secondary | ICD-10-CM | POA: Diagnosis not present

## 2019-11-26 DIAGNOSIS — Z01812 Encounter for preprocedural laboratory examination: Secondary | ICD-10-CM | POA: Diagnosis not present

## 2019-11-26 LAB — SARS CORONAVIRUS 2 (TAT 6-24 HRS): SARS Coronavirus 2: NEGATIVE

## 2019-11-26 NOTE — Progress Notes (Signed)
   Covid-19 Vaccination Clinic  Name:  Carol [name]    MRN: [id number] DOB: June 22, 1946  11/26/2019  [name] was observed post Covid-19 immunization for 15 minutes without incident. She was provided with Vaccine Information Sheet and instruction to access the V-Safe system.   [name] was instructed to call 911 with any severe reactions post vaccine: Marland Kitchen Difficulty breathing  . Swelling of face and throat  . A fast heartbeat  . A bad rash all over body  . Dizziness and weakness   Immunizations Administered    Name Date Dose VIS Date Route   Pfizer COVID-19 Vaccine 11/26/2019  8:51 AM 0.3 mL 09/06/2019 Intramuscular   Manufacturer: [place]   Lot: [id number]   [place]: [id number]

## 2019-11-27 ENCOUNTER — Ambulatory Visit: Payer: Self-pay | Admitting: General Surgery

## 2019-11-28 NOTE — Progress Notes (Signed)
Patient notified of updated arrival time of 0900 and verbalized understanding.

## 2019-11-29 ENCOUNTER — Ambulatory Visit (HOSPITAL_BASED_OUTPATIENT_CLINIC_OR_DEPARTMENT_OTHER): Payer: BC Managed Care – PPO | Admitting: Anesthesiology

## 2019-11-29 ENCOUNTER — Ambulatory Visit (HOSPITAL_BASED_OUTPATIENT_CLINIC_OR_DEPARTMENT_OTHER)
Admission: RE | Admit: 2019-11-29 | Discharge: 2019-11-29 | Disposition: A | Discharge status: Home / Self Care | Payer: BC Managed Care – PPO | Attending: General Surgery | Admitting: General Surgery

## 2019-11-29 ENCOUNTER — Encounter (HOSPITAL_BASED_OUTPATIENT_CLINIC_OR_DEPARTMENT_OTHER)
Admission: RE | Disposition: A | Discharge status: Home / Self Care | Payer: Self-pay | Source: Home / Self Care | Attending: General Surgery

## 2019-11-29 ENCOUNTER — Encounter (HOSPITAL_BASED_OUTPATIENT_CLINIC_OR_DEPARTMENT_OTHER): Payer: Self-pay | Admitting: General Surgery

## 2019-11-29 DIAGNOSIS — Z79899 Other long term (current) drug therapy: Secondary | ICD-10-CM | POA: Insufficient documentation

## 2019-11-29 DIAGNOSIS — G43909 Migraine, unspecified, not intractable, without status migrainosus: Secondary | ICD-10-CM | POA: Diagnosis not present

## 2019-11-29 DIAGNOSIS — R569 Unspecified convulsions: Secondary | ICD-10-CM | POA: Insufficient documentation

## 2019-11-29 DIAGNOSIS — R159 Full incontinence of feces: Secondary | ICD-10-CM | POA: Insufficient documentation

## 2019-11-29 HISTORY — DX: Spondylosis without myelopathy or radiculopathy, cervical region: M47.812

## 2019-11-29 HISTORY — DX: Personal history of other diseases of the respiratory system: Z87.09

## 2019-11-29 HISTORY — DX: Presence of spectacles and contact lenses: Z97.3

## 2019-11-29 HISTORY — DX: Mixed incontinence: N39.46

## 2019-11-29 HISTORY — DX: Other chronic pain: G89.29

## 2019-11-29 SURGERY — INSERTION, NEUROSTIMULATOR, SACRAL
Anesthesia: Monitor Anesthesia Care | Site: Buttocks

## 2019-11-29 MED ORDER — TRAMADOL HCL 50 MG PO TABS: 50.0000 mg | ORAL_TABLET | Freq: Four times a day (QID) | ORAL | 0 refills | Status: AC | PRN

## 2019-11-29 MED ORDER — CEFAZOLIN SODIUM-DEXTROSE 2-4 GM/100ML-% IV SOLN
INTRAVENOUS | Status: AC
  Filled 2019-11-29: qty 100

## 2019-11-29 MED ORDER — OXYCODONE HCL 5 MG/5ML PO SOLN
5.0000 mg | Freq: Once | ORAL | Status: DC | PRN
  Filled 2019-11-29: qty 5

## 2019-11-29 MED ORDER — CEFAZOLIN SODIUM-DEXTROSE 2-4 GM/100ML-% IV SOLN
2.0000 g | INTRAVENOUS | Status: AC
  Administered 2019-11-29: 2 g via INTRAVENOUS
  Filled 2019-11-29: qty 100

## 2019-11-29 MED ORDER — SODIUM CHLORIDE 0.9 % IV SOLN
250.0000 mL | INTRAVENOUS | Status: DC | PRN
  Filled 2019-11-29: qty 250

## 2019-11-29 MED ORDER — MIDAZOLAM HCL 2 MG/2ML IJ SOLN
INTRAMUSCULAR | Status: DC | PRN
  Administered 2019-11-29: 1 mg via INTRAVENOUS

## 2019-11-29 MED ORDER — OXYCODONE HCL 5 MG PO TABS
5.0000 mg | ORAL_TABLET | Freq: Once | ORAL | Status: DC | PRN
  Filled 2019-11-29: qty 1

## 2019-11-29 MED ORDER — ONDANSETRON HCL 4 MG/2ML IJ SOLN
INTRAMUSCULAR | Status: AC
  Filled 2019-11-29: qty 2

## 2019-11-29 MED ORDER — OXYCODONE HCL 5 MG PO TABS
5.0000 mg | ORAL_TABLET | ORAL | Status: DC | PRN
  Filled 2019-11-29: qty 2

## 2019-11-29 MED ORDER — KETAMINE INJECTION FOR OPTIME (MG/KG/HR) DOCUMENTATION
INTRAVENOUS | Status: DC | PRN
  Administered 2019-11-29: 2 mg/kg/h via INTRAVENOUS

## 2019-11-29 MED ORDER — BUPIVACAINE-EPINEPHRINE 0.5% -1:200000 IJ SOLN
INTRAMUSCULAR | Status: DC | PRN
  Administered 2019-11-29: 20 mL

## 2019-11-29 MED ORDER — STERILE WATER FOR IRRIGATION IR SOLN
Status: DC | PRN
  Administered 2019-11-29: 500 mL

## 2019-11-29 MED ORDER — SODIUM CHLORIDE 0.9% FLUSH
3.0000 mL | Freq: Two times a day (BID) | INTRAVENOUS | Status: DC
  Filled 2019-11-29: qty 3

## 2019-11-29 MED ORDER — ACETAMINOPHEN 650 MG RE SUPP
650.0000 mg | RECTAL | Status: DC | PRN
  Filled 2019-11-29: qty 1

## 2019-11-29 MED ORDER — PROPOFOL 500 MG/50ML IV EMUL
INTRAVENOUS | Status: AC
  Filled 2019-11-29: qty 50

## 2019-11-29 MED ORDER — PROPOFOL 500 MG/50ML IV EMUL
INTRAVENOUS | Status: DC | PRN
  Administered 2019-11-29: 200 ug/kg/min via INTRAVENOUS

## 2019-11-29 MED ORDER — MIDAZOLAM HCL 2 MG/2ML IJ SOLN
INTRAMUSCULAR | Status: AC
  Filled 2019-11-29: qty 2

## 2019-11-29 MED ORDER — ONDANSETRON HCL 4 MG/2ML IJ SOLN
4.0000 mg | Freq: Once | INTRAMUSCULAR | Status: DC | PRN
  Filled 2019-11-29: qty 2

## 2019-11-29 MED ORDER — FENTANYL CITRATE (PF) 100 MCG/2ML IJ SOLN
25.0000 ug | INTRAMUSCULAR | Status: DC | PRN
  Filled 2019-11-29: qty 1

## 2019-11-29 MED ORDER — ACETAMINOPHEN 500 MG PO TABS
ORAL_TABLET | ORAL | Status: AC
  Filled 2019-11-29: qty 2

## 2019-11-29 MED ORDER — SODIUM CHLORIDE 0.9% FLUSH
3.0000 mL | INTRAVENOUS | Status: DC | PRN
  Filled 2019-11-29: qty 3

## 2019-11-29 MED ORDER — ACETAMINOPHEN 500 MG PO TABS
1000.0000 mg | ORAL_TABLET | ORAL | Status: AC
  Administered 2019-11-29: 1000 mg via ORAL
  Filled 2019-11-29: qty 2

## 2019-11-29 MED ORDER — ACETAMINOPHEN 325 MG PO TABS
650.0000 mg | ORAL_TABLET | ORAL | Status: DC | PRN
  Filled 2019-11-29: qty 2

## 2019-11-29 MED ORDER — LIDOCAINE 2% (20 MG/ML) 5 ML SYRINGE
INTRAMUSCULAR | Status: DC | PRN
  Administered 2019-11-29: 25 mg via INTRAVENOUS

## 2019-11-29 MED ORDER — LACTATED RINGERS IV SOLN
INTRAVENOUS | Status: DC
  Filled 2019-11-29: qty 1000

## 2019-11-29 MED ORDER — KETAMINE HCL 10 MG/ML IJ SOLN
INTRAMUSCULAR | Status: AC
  Filled 2019-11-29: qty 1

## 2019-11-29 MED ORDER — ONDANSETRON HCL 4 MG/2ML IJ SOLN
INTRAMUSCULAR | Status: DC | PRN
  Administered 2019-11-29: 4 mg via INTRAVENOUS

## 2019-11-29 SURGICAL SUPPLY — 52 items
BAG URINE LEG 500ML (DRAIN) IMPLANT
BLADE HEX COATED 2.75 (ELECTRODE) ×2 IMPLANT
BLADE SURG 15 STRL LF DISP TIS (BLADE) ×1 IMPLANT
BLADE SURG 15 STRL SS (BLADE) ×2
CABLE TEST STIMULATION (UROLOGICAL SUPPLIES) IMPLANT
CABLE TWIST LOCK 25CM (UROLOGICAL SUPPLIES) IMPLANT
CHLORAPREP W/TINT 26 (MISCELLANEOUS) ×2 IMPLANT
COVER BACK TABLE 60X90IN (DRAPES) ×2 IMPLANT
COVER MAYO STAND STRL (DRAPES) ×2 IMPLANT
COVER PROBE W GEL 5X96 (DRAPES) IMPLANT
COVER WAND RF STERILE (DRAPES) ×4 IMPLANT
DERMABOND ADVANCED (GAUZE/BANDAGES/DRESSINGS) ×1
DERMABOND ADVANCED .7 DNX12 (GAUZE/BANDAGES/DRESSINGS) ×1 IMPLANT
DRAPE C-ARM 42X72 X-RAY (DRAPES) IMPLANT
DRAPE INCISE IOBAN 66X45 STRL (DRAPES) ×2 IMPLANT
DRAPE LAPAROSCOPIC ABDOMINAL (DRAPES) ×2 IMPLANT
DRAPE SHEET LG 3/4 BI-LAMINATE (DRAPES) IMPLANT
DRAPE UTILITY XL STRL (DRAPES) ×2 IMPLANT
DRSG TEGADERM 4X4.75 (GAUZE/BANDAGES/DRESSINGS) ×2 IMPLANT
ELECT REM PT RETURN 9FT ADLT (ELECTROSURGICAL) ×2
ELECTRODE REM PT RTRN 9FT ADLT (ELECTROSURGICAL) ×1 IMPLANT
GAUZE SPONGE 4X4 12PLY STRL (GAUZE/BANDAGES/DRESSINGS) IMPLANT
GLOVE BIO SURGEON STRL SZ 6 (GLOVE) ×2 IMPLANT
GLOVE BIO SURGEON STRL SZ 6.5 (GLOVE) ×2 IMPLANT
GLOVE BIOGEL PI IND STRL 6.5 (GLOVE) ×1 IMPLANT
GLOVE BIOGEL PI IND STRL 7.0 (GLOVE) ×2 IMPLANT
GLOVE BIOGEL PI IND STRL 7.5 (GLOVE) ×2 IMPLANT
GLOVE BIOGEL PI INDICATOR 6.5 (GLOVE) ×1
GLOVE BIOGEL PI INDICATOR 7.0 (GLOVE) ×2
GLOVE BIOGEL PI INDICATOR 7.5 (GLOVE) ×2
GOWN STRL REUS W/TWL XL LVL3 (GOWN DISPOSABLE) ×2 IMPLANT
INTRODUCER GUIDE DILATR SHEATH (SET/KITS/TRAYS/PACK) IMPLANT
KIT HANDSET INTERSTIM COMM (NEUROSURGERY SUPPLIES) ×2 IMPLANT
KIT TURNOVER CYSTO (KITS) ×2 IMPLANT
NEEDLE HYPO 22GX1.5 SAFETY (NEEDLE) ×2 IMPLANT
NEUROSTIMULATOR 1.7X2X.06 (UROLOGICAL SUPPLIES) IMPLANT
PACK BASIN DAY SURGERY FS (CUSTOM PROCEDURE TRAY) ×2 IMPLANT
PAD ARMBOARD 7.5X6 YLW CONV (MISCELLANEOUS) IMPLANT
PENCIL BUTTON HOLSTER BLD 10FT (ELECTRODE) ×2 IMPLANT
PROGRAMMER SMART TH90G01 (UROLOGICAL SUPPLIES) IMPLANT
STIMULATOR INTERSTIM 2X1.7X.3 (Miscellaneous) ×2 IMPLANT
STRIP CLOSURE SKIN 1/2X4 (GAUZE/BANDAGES/DRESSINGS) IMPLANT
SUT SILK 2 0 TIES 17X18 (SUTURE)
SUT SILK 2-0 18XBRD TIE BLK (SUTURE) IMPLANT
SUT VIC AB 3-0 SH 27 (SUTURE) ×2
SUT VIC AB 3-0 SH 27X BRD (SUTURE) ×1 IMPLANT
SUT VICRYL 4-0 PS2 18IN ABS (SUTURE) ×2 IMPLANT
SYR BULB IRRIGATION 50ML (SYRINGE) ×2 IMPLANT
SYR CONTROL 10ML LL (SYRINGE) ×2 IMPLANT
TOWEL OR 17X26 10 PK STRL BLUE (TOWEL DISPOSABLE) ×4 IMPLANT
TUBE CONNECTING 12X1/4 (SUCTIONS) IMPLANT
WATER STERILE IRR 500ML POUR (IV SOLUTION) ×2 IMPLANT

## 2019-11-29 NOTE — Interval H&P Note (Signed)
History and Physical Interval Note:  11/29/2019 9:20 AM  Carol [name]  has presented today for surgery, with the diagnosis of FECAL INCONTINENCE.  The various methods of treatment have been discussed with the patient and family. After consideration of risks, benefits and other options for treatment, the patient has consented to  Procedure(s): INSERTION OF SACRAL NERVE STIMULATOR PULSE GENERATOR VS REMOVAL OF TEST WIRE (N/A) as a surgical intervention.  The patient's history has been reviewed, patient examined, no change in status, stable for surgery.  I have reviewed the patient's chart and labs.  She has had a good response to the test device and is ready for permanent implantation of the sacral nerve stimulator battery pack.  Questions were answered to the patient's satisfaction.     Rosario Adie, MD  [facility]

## 2019-11-29 NOTE — Discharge Instructions (Addendum)
GENERAL SURGERY: POST OP INSTRUCTIONS  1. DIET: Follow a light bland diet the first 24 hours after arrival home, such as soup, liquids, crackers, etc.  Be sure to include lots of fluids daily.  Avoid fast food or heavy meals as your are more likely to get nauseated.   2. Take your usually prescribed home medications unless otherwise directed. 3. PAIN CONTROL: a. Pain is best controlled by a usual combination of three different methods TOGETHER: i. Ice/Heat ii. Over the counter pain medication iii. Prescription pain medication b. Most patients will experience some swelling and bruising around the incisions.  Ice packs or heating pads (30-60 minutes up to 6 times a day) will help. Use ice for the first few days to help decrease swelling and bruising, then switch to heat to help relax tight/sore spots and speed recovery.  Some people prefer to use ice alone, heat alone, alternating between ice & heat.  Experiment to what works for you.  Swelling and bruising can take several weeks to resolve.   c. It is helpful to take an over-the-counter pain medication regularly for the first few weeks.  Choose one of the following that works best for you: i. Naproxen (Aleve, etc)  Two 220mg  tabs twice a day ii. Ibuprofen (Advil, etc) Three 200mg  tabs four times a day (every meal & bedtime) d. A  prescription for pain medication (such as Percocet, oxycodone, hydrocodone, etc) should be given to you upon discharge.  Take your pain medication as prescribed.  i. If you are having problems/concerns with the prescription medicine (does not control pain, nausea, vomiting, rash, itching, etc), please call us [phone] to see if we need to switch you to a different pain medicine that will work better for you and/or control your side effect better. ii. If you need a refill on your pain medication, please contact your pharmacy.  They will contact our office to request authorization. Prescriptions will not be filled after 5  pm or on week-ends. 4. Avoid getting constipated.  Between the surgery and the pain medications, it is common to experience some constipation.  Increasing fluid intake and taking a fiber supplement (such as Metamucil, Citrucel, FiberCon, MiraLax, etc) 1-2 times a day regularly will usually help prevent this problem from occurring.  A mild laxative (prune juice, Milk of Magnesia, MiraLax, etc) should be taken according to package directions if there are no bowel movements after 48 hours.   5. Wash / shower every day.  You can remove your bandage on Sat morning and shower.  Continue to shower over incision(s) after the dressing is off.  You have skin glue on your incision, which will wear off over 7-10 days.    6. ACTIVITIES as tolerated:   a. You may resume regular (light) daily activities beginning the next day--such as daily self-care, walking, climbing stairs--gradually increasing activities as tolerated.  If you can walk 30 minutes without difficulty, it is safe to try more intense activity such as jogging, treadmill, bicycling, low-impact aerobics, swimming, etc. b. Save the most intensive and strenuous activity for last such as sit-ups, heavy lifting, contact sports, etc  Refrain from any heavy lifting or straining until you are off narcotics for pain control.   c. DO NOT PUSH THROUGH PAIN.  Let pain be your guide: If it hurts to do something, don't do it.  Pain is your body warning you to avoid that activity for another week until the pain goes down. d. You may drive  when you are no longer taking prescription pain medication, you can comfortably wear a seatbelt, and you can safely maneuver your car and apply brakes. e. Dennis Bast may have sexual intercourse when it is comfortable.  7. FOLLOW UP in our office a. Please call [facility] at [phone] to set up an appointment to see your surgeon in the office for a follow-up appointment approximately 2-3 weeks after your surgery. b. Make sure that you call for  this appointment the day you arrive home to insure a convenient appointment time. 9. IF YOU HAVE DISABILITY OR FAMILY LEAVE FORMS, BRING THEM TO THE OFFICE FOR PROCESSING.  DO NOT GIVE THEM TO YOUR DOCTOR.   WHEN TO CALL us [phone]: 1. Poor pain control 2. Reactions / problems with new medications (rash/itching, nausea, etc)  3. Fever over 101.5 F (38.5 C) 4. Worsening swelling or bruising 5. Continued bleeding from incision. 6. Increased pain, redness, or drainage from the incision   The clinic staff is available to answer your questions during regular business hours (8:30am-5pm).  Please don't hesitate to call and ask to speak to one of our nurses for clinical concerns.   If you have a medical emergency, go to the nearest emergency room or call 911.  A surgeon from [facility] is always on call at the [facility], [place], [place], [place],   [address] ? MAIN: [phone] ? TOLL FREE: [phone] ?  FAX [phone] [email or web address]     Post Anesthesia Home Care Instructions  Activity: Get plenty of rest for the remainder of the day. A responsible individual must stay with you for 24 hours following the procedure.  For the next 24 hours, DO NOT: -Drive a car -Paediatric nurse -Drink alcoholic beverages -Take any medication unless instructed by your physician -Make any legal decisions or sign important papers.  Meals: Start with liquid foods such as gelatin or soup. Progress to regular foods as tolerated. Avoid greasy, spicy, heavy foods. If nausea and/or vomiting occur, drink only clear liquids until the nausea and/or vomiting subsides. Call your physician if vomiting continues.  Special Instructions/Symptoms: Your throat may feel dry or sore from the anesthesia or the breathing tube placed in your throat during surgery. If this causes discomfort, gargle with warm salt water. The  discomfort should disappear within 24 hours.   Do not take any Tylenol until after 3:30 pm today.

## 2019-11-29 NOTE — Anesthesia Preprocedure Evaluation (Signed)
Anesthesia Evaluation  Patient identified by MRN, date of birth, ID band Patient awake    Reviewed: Allergy & Precautions, NPO status , Patient's Chart, lab work & pertinent test results  Airway Mallampati: I  TM Distance: >3 FB Neck ROM: Full    Dental no notable dental hx. (+) Teeth Intact, Dental Advisory Given   Pulmonary neg pulmonary ROS,    Pulmonary exam normal        Cardiovascular negative cardio ROS Normal cardiovascular exam     Neuro/Psych Seizures -, Well Controlled,  negative psych ROS   GI/Hepatic negative GI ROS, Neg liver ROS,   Endo/Other  negative endocrine ROS  Renal/GU negative Renal ROS  negative genitourinary   Musculoskeletal negative musculoskeletal ROS (+)   Abdominal Normal abdominal exam  (+)   Peds negative pediatric ROS (+)  Hematology negative hematology ROS (+)   Anesthesia Other Findings   Reproductive/Obstetrics negative OB ROS                             Anesthesia Physical  Anesthesia Plan  ASA: II  Anesthesia Plan: MAC   Post-op Pain Management:    Induction:   PONV Risk Score and Plan: 2 and Propofol infusion, Treatment may vary due to age or medical condition and TIVA  Airway Management Planned: Natural Airway and Nasal Cannula  Additional Equipment: None  Intra-op Plan:   Post-operative Plan:   Informed Consent: I have reviewed the patients History and Physical, chart, labs and discussed the procedure including the risks, benefits and alternatives for the proposed anesthesia with the patient or authorized representative who has indicated his/her understanding and acceptance.       Plan Discussed with:   Anesthesia Plan Comments:         Anesthesia Quick Evaluation

## 2019-11-29 NOTE — Op Note (Signed)
11/29/2019  11:08 AM  PATIENT:  Carol [name]  74 y.o. female  Patient Care Team: Inda Coke, [place] as PCP - General (Physician Assistant) Delice Lesch Lezlie Octave, MD as Consulting Physician (Neurology)  PRE-OPERATIVE DIAGNOSIS:  FECAL INCONTINENCE  POST-OPERATIVE DIAGNOSIS:  FECAL INCONTINENCE  PROCEDURE: INSERTION OF SACRAL NERVE STIMULATOR PULSE GENERATOR STAGE 2    Surgeon(s): Leighton Ruff, MD  ASSISTANT: none   ANESTHESIA:   local  EBL:  Total I/O In: 100 [IV Piggyback:100] Out: -   DRAINS: none   SPECIMEN:  No Specimen  DISPOSITION OF SPECIMEN:  N/A  COUNTS:  YES  PLAN OF CARE: Discharge to home after PACU  PATIENT DISPOSITION:  PACU - hemodynamically stable.  INDICATION: Fecal incontinence.  The patient has underwent the test phase for 7 days and has had 3 episodes since then.  This is a 80-90% reduction in symptoms.  The patient agrees to proceed with full implantation of the neuro stimulator device.   OR FINDINGS: Wire lead intact.  Impedence values < 1000.  DESCRIPTION: The patient was identified in the preoperative holding area and taken to the OR where they were laid prone on the operating room table.  MAC anesthesia was induced without difficulty. SCDs were also noted to be in place prior to the initiation of anesthesia.  The patient was then prepped and draped in the usual sterile fashion.   A surgical timeout was performed indicating the correct patient, procedure, positioning and need for preoperative antibiotics.  Local injection of Marcaine was administered.  The previous upper buttock incision was opened using blunt dissection and the lead/percutaneous connection was identified and evaluated. The percutaneous extension was cut on the braided wire and was removed from the field ensuring sterility. The subcutaneous pocket was enlarged to accommodate the internal neurostimulator (INS) and hemostasis was established. The sutures securing the protective  boot were cut and the boot retracted to expose the four set screws. Using the torque wrench, the screws were loosened and the percutaneous extension was disconnected from the lead. The boot and connection were removed and discarded.  The lead was cleaned and dried. The lead was inserted into the header of the InterStim neurostimulator until the blue tip was visualized at the distal window. The single set screw was tightened using the torque wrench until audible click was heard. The neurostimulator was placed into the pocket with the etched identification side placed upwards and any excessive lead was placed around the neurostimulator. The clinician programmer telemetry head, covered in a sterile sleeve, was placed over the implanted neurostimulator to ensure proper lead connection and that parameters were within normal range. Impedances were confirmed to be within normal limits, greater than 50 and less than 4,000 ohms. The wound was closed with a running 3-0 Vicryl subcuticular suture and a 4-0 Vicryl running skin suture. Counts were correct.  Dermabond was placed over the incision. The patient was transferred to the PACU in satisfactory condition. Using the clinician programmer, the INS was programmed.   Patient was provided utilization instructions for the patient programmer prior to discharge.

## 2019-11-29 NOTE — Anesthesia Postprocedure Evaluation (Signed)
Anesthesia Post Note  Patient: Carol [name]  Procedure(s) Performed: INSERTION OF SACRAL NERVE STIMULATOR PULSE GENERATOR STAGE 2  (N/A Buttocks)     Patient location during evaluation: PACU Anesthesia Type: MAC Level of consciousness: awake and alert Pain management: pain level controlled Vital Signs Assessment: post-procedure vital signs reviewed and stable Respiratory status: spontaneous breathing, nonlabored ventilation and respiratory function stable Cardiovascular status: blood pressure returned to baseline and stable Postop Assessment: no apparent nausea or vomiting Anesthetic complications: no    Last Vitals:  Vitals:   11/29/19 1200 11/29/19 1225  BP: (!) 102/55 123/68  Pulse: 71 66  Resp: (!) 25 18  Temp:    SpO2: 98% 97%    Last Pain:  Vitals:   11/29/19 1200  TempSrc:   PainSc: 1                  Lidia Collum

## 2019-11-29 NOTE — Transfer of Care (Signed)
    Last Vitals:  Vitals Value Taken Time  BP 109/58 11/29/19 1115  Temp 36.4 C 11/29/19 1115  Pulse 69 11/29/19 1116  Resp 19 11/29/19 1116  SpO2 95 % 11/29/19 1116  Vitals shown include unvalidated device data.  Last Pain:  Vitals:   11/29/19 0905  TempSrc: Oral         Complications: Immediate Anesthesia Transfer of Care Note  Patient: Carol [name]  Procedure(s) Performed: Procedure(s) (LRB): INSERTION OF SACRAL NERVE STIMULATOR PULSE GENERATOR STAGE 2  (N/A)  Patient Location: PACU  Anesthesia Type: MAC  Level of Consciousness: awake, alert  and oriented  Airway & Oxygen Therapy: Patient Spontanous Breathing and Patient connected to face mask oxygen  Post-op Assessment: Report given to PACU RN and Post -op Vital signs reviewed and stable  Post vital signs: Reviewed and stable  Complications: No apparent anesthesia complications

## 2020-01-01 ENCOUNTER — Telehealth: Payer: Self-pay | Admitting: Physician Assistant

## 2020-01-01 NOTE — Telephone Encounter (Signed)
Left message for patient to call back and schedule Medicare Annual Wellness Visit (AWV) either virtually/audio only OR in office. Whatever the patients preference is.  No hx; please schedule at anytime with [facility]-Nurse Health Advisor at [facility].

## 2020-01-30 ENCOUNTER — Other Ambulatory Visit: Payer: Self-pay | Admitting: Family Medicine

## 2020-01-30 DIAGNOSIS — Z1231 Encounter for screening mammogram for malignant neoplasm of breast: Secondary | ICD-10-CM

## 2020-02-17 ENCOUNTER — Ambulatory Visit: Payer: BC Managed Care – PPO | Admitting: Neurology

## 2020-03-17 ENCOUNTER — Other Ambulatory Visit: Payer: Self-pay

## 2020-03-17 ENCOUNTER — Ambulatory Visit (INDEPENDENT_AMBULATORY_CARE_PROVIDER_SITE_OTHER): Payer: BC Managed Care – PPO | Admitting: Neurology

## 2020-03-17 ENCOUNTER — Encounter: Payer: Self-pay | Admitting: Neurology

## 2020-03-17 VITALS — BP 153/84 | HR 70 | Ht 61.0 in | Wt 153.6 lb

## 2020-03-17 DIAGNOSIS — G40009 Localization-related (focal) (partial) idiopathic epilepsy and epileptic syndromes with seizures of localized onset, not intractable, without status epilepticus: Secondary | ICD-10-CM

## 2020-03-17 MED ORDER — OXCARBAZEPINE 300 MG PO TABS: ORAL_TABLET | ORAL | 3 refills | Status: DC
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
## 2020-03-17 NOTE — Patient Instructions (Signed)
Good to see you! Continue oxcarbazepine 300mg  twice a day. Follow-up in 6 months, call for any changes.  Seizure Precautions: 1. If medication has been prescribed for you to prevent seizures, take it exactly as directed.  Do not stop taking the medicine without talking to your doctor first, even if you have not had a seizure in a long time.   2. Avoid activities in which a seizure would cause danger to yourself or to others.  Don't operate dangerous machinery, swim alone, or climb in high or dangerous places, such as on ladders, roofs, or girders.  Do not drive unless your doctor says you may.  3. If you have any warning that you may have a seizure, lay down in a safe place where you can't hurt yourself.    4.  No driving for 6 months from last seizure, as per [facility]   Please refer to the following link on the [place] website for more information: http://www.epilepsyfoundation.org/answerplace/Social/driving/drivingu.cfm   5.  Maintain good sleep hygiene. Avoid alcohol.  6.  Contact your doctor if you have any problems that may be related to the medicine you are taking.  7.  Call 911 and bring the patient back to the ED if:        A.  The seizure lasts longer than 5 minutes.       B.  The patient doesn't awaken shortly after the seizure  C.  The patient has new problems such as difficulty seeing, speaking or moving  D.  The patient was injured during the seizure  E.  The patient has a temperature over 102 F (39C)  F.  The patient vomited and now is having trouble breathing

## 2020-03-31 LAB — COMPREHENSIVE METABOLIC PANEL
Albumin: 4.7 (ref 3.5–5.0)
Calcium: 10 (ref 8.7–10.7)
GFR calc Af Amer: 70
GFR calc non Af Amer: 60
Globulin: 2

## 2020-03-31 LAB — LIPID PANEL
Cholesterol: 172 (ref 0–200)
HDL: 60 (ref 35–70)
LDL Cholesterol: 97
Triglycerides: 82 (ref 40–160)

## 2020-03-31 LAB — VITAMIN D 25 HYDROXY (VIT D DEFICIENCY, FRACTURES): Vit D, 25-Hydroxy: 80.9

## 2020-03-31 LAB — BASIC METABOLIC PANEL
BUN: 15 (ref 4–21)
Chloride: 104 (ref 99–108)
Creatinine: 1 (ref 0.5–1.1)
Glucose: 102
Potassium: 5.1 (ref 3.4–5.3)
Sodium: 140 (ref 137–147)

## 2020-03-31 LAB — CBC AND DIFFERENTIAL
HCT: 43 (ref 36–46)
Hemoglobin: 14.9 (ref 12.0–16.0)
Neutrophils Absolute: 2
Platelets: 302 (ref 150–399)
WBC: 4

## 2020-03-31 LAB — HEPATIC FUNCTION PANEL
ALT: 12 (ref 7–35)
AST: 14 (ref 13–35)
Alkaline Phosphatase: 114 (ref 25–125)
Bilirubin, Total: 0.4

## 2020-03-31 LAB — TSH: TSH: 2.33 (ref 0.41–5.90)

## 2020-03-31 LAB — IRON,TIBC AND FERRITIN PANEL: Iron: 121

## 2020-03-31 LAB — CBC: RBC: 4.85 (ref 3.87–5.11)

## 2020-04-27 ENCOUNTER — Ambulatory Visit (INDEPENDENT_AMBULATORY_CARE_PROVIDER_SITE_OTHER): Payer: BC Managed Care – PPO | Admitting: Physician Assistant

## 2020-04-27 ENCOUNTER — Ambulatory Visit (INDEPENDENT_AMBULATORY_CARE_PROVIDER_SITE_OTHER)
Admission: RE | Admit: 2020-04-27 | Discharge: 2020-04-27 | Disposition: A | Discharge status: Home / Self Care | Payer: BC Managed Care – PPO | Source: Ambulatory Visit | Attending: Physician Assistant | Admitting: Physician Assistant

## 2020-04-27 ENCOUNTER — Encounter: Payer: Self-pay | Admitting: Physician Assistant

## 2020-04-27 ENCOUNTER — Other Ambulatory Visit: Payer: Self-pay

## 2020-04-27 VITALS — BP 118/68 | HR 78 | Temp 98.1°F | Ht 61.0 in | Wt 138.0 lb

## 2020-04-27 DIAGNOSIS — M546 Pain in thoracic spine: Secondary | ICD-10-CM

## 2020-04-27 DIAGNOSIS — R7989 Other specified abnormal findings of blood chemistry: Secondary | ICD-10-CM

## 2020-04-27 DIAGNOSIS — Z0001 Encounter for general adult medical examination with abnormal findings: Secondary | ICD-10-CM | POA: Diagnosis not present

## 2020-04-27 DIAGNOSIS — R109 Unspecified abdominal pain: Secondary | ICD-10-CM

## 2020-04-27 DIAGNOSIS — M542 Cervicalgia: Secondary | ICD-10-CM | POA: Diagnosis not present

## 2020-04-27 DIAGNOSIS — R159 Full incontinence of feces: Secondary | ICD-10-CM

## 2020-04-27 DIAGNOSIS — E785 Hyperlipidemia, unspecified: Secondary | ICD-10-CM

## 2020-04-27 DIAGNOSIS — M8589 Other specified disorders of bone density and structure, multiple sites: Secondary | ICD-10-CM

## 2020-04-27 DIAGNOSIS — G40009 Localization-related (focal) (partial) idiopathic epilepsy and epileptic syndromes with seizures of localized onset, not intractable, without status epilepticus: Secondary | ICD-10-CM

## 2020-04-27 NOTE — Progress Notes (Signed)
I acted as a Education administrator for [employer], PA-C Anselmo Pickler, LPN    Subjective:    Carol [name] is a 74 y.o. female and is here for a comprehensive physical exam.  She had full labs completed last month. She has these for me to review.  HPI  Health Maintenance Due  Topic Date Due  . Hepatitis C Screening  Never done    Acute Concerns: Right sided pain -- she has been having chronic, mild dull pain to the R side of her abdomen for a few weeks. Reports that her pain is all the time. 1/10. Feels like she stretched the wrong way and had lingering pain. Denies changes to her skin, severe pain, abrupt changes to stools. L-sided thoracic back pain -- has a tender area on her thoracic paraspinal area that is tender to touch. Denies known injury.  Chronic Issues: Epilepsy -- currently taking Trileptal 300 mg BID. Has not had a seizure in almost in one year. Husband had witnessed the two that she had prior. Chronic pain of neck -- taking gabapentin and tramadol; takes tramadol less than once a week Incontinence of fecal matter - insertion of sacral nerve stimulator was put in March 2021, this never worked well for her and she has it turned off. She is now wearing pads constantly done. Elevated phosphorus -- serum phosphorus was elevated at 5.4  Health Maintenance: Immunizations -- UTD Colonoscopy -- UTD, due 02/2024 Mammogram -- overdue, scheduled Aug 17th PAP -- seeing GYN Aug 17th Bone Density -- due last done 04/2018 low bone density mass Diet -- has cut down on her Dr. Malachi Bonds; cut out bread Caffeine intake -- Dr. Malachi Bonds Sleep habits -- has a dog Exercise -- walking 3 miles a day Weight -- Weight: 138 lb (62.6 kg)  Mood -- overall feels good Weight history: Wt Readings from Last 10 Encounters:  04/27/20 138 lb (62.6 kg)  03/17/20 153 lb 9.6 oz (69.7 kg)  11/29/19 150 lb 8 oz (68.3 kg)  11/21/19 151 lb 3.2 oz (68.6 kg)  10/15/19 155 lb (70.3 kg)  07/19/19 150 lb (68  kg)  02/21/19 145 lb (65.8 kg)  01/28/19 159 lb 12 oz (72.5 kg)  09/21/18 150 lb (68 kg)  05/01/18 156 lb (70.8 kg)   Body mass index is 26.07 kg/m. No LMP recorded. Patient is postmenopausal. Period characteristics:  none Alcohol use: none Tobacco use: none  Depression screen PHQ 2/9 04/27/2020  Decreased Interest 0  Down, Depressed, Hopeless 0  PHQ - 2 Score 0     Other providers/specialists: Patient Care Team: Inda Coke, [place] as PCP - General (Physician Assistant) Cameron Sprang, MD as Consulting Physician (Neurology)    PMHx, SurgHx, SocialHx, Medications, and Allergies were reviewed in the Visit Navigator and updated as appropriate.   Past Medical History:  Diagnosis Date  . Cervical spondylosis without myelopathy   . Chronic neck pain   . Fecal incontinence   . History of asthma    per pt has issues with [place] smoke, no issues since age 11's when smoking was ban inside  . History of kidney stones   . Idiopathic generalized epilepsy [facility]) neurologist---  dr Delice Lesch   new onset seizure april and august 2020;  dx 10/ 2020    (11-25-2019 pt stated last episode 01/ 19/ 2021 was noctural)  . Mixed stress and urge urinary incontinence   . Wears contact lenses      Past Surgical History:  Procedure Laterality Date  . ANAL RECTAL MANOMETRY N/A 10/09/2019   Procedure: ANAL MANOMETRY;  Surgeon: Leighton Ruff, MD;  Location: [facility];  Service: Endoscopy;  Laterality: N/A;  . DILATION AND CURETTAGE OF UTERUS    . ELBOW SURGERY Right   . NECK SURGERY     x  6 3 plates in neck  . TOE SURGERY Left    x 4      Family History  Problem Relation Age of Onset  . COPD Mother   . COPD Father     Social History   Tobacco Use  . Smoking status: Never Smoker  . Smokeless tobacco: Never Used  Vaping Use  . Vaping Use: Never used  Substance Use Topics  . Alcohol use: No  . Drug use: Never    Review of Systems:   Review of Systems  Constitutional:  Negative.  Negative for chills, fever, malaise/fatigue and weight loss.  HENT: Negative.  Negative for hearing loss, sinus pain and sore throat.   Eyes: Negative.  Negative for blurred vision.  Respiratory: Negative.  Negative for cough and shortness of breath.   Cardiovascular: Negative.  Negative for chest pain, palpitations and leg swelling.  Gastrointestinal: Positive for abdominal pain. Negative for constipation, diarrhea, heartburn, nausea and vomiting.  Genitourinary: Negative.  Negative for dysuria, frequency and urgency.  Musculoskeletal: Negative.  Negative for back pain, myalgias and neck pain.  Skin: Negative.  Negative for itching and rash.  Neurological: Negative.  Negative for dizziness, tingling, seizures, loss of consciousness and headaches.  Endo/Heme/Allergies: Negative.  Negative for polydipsia.  Psychiatric/Behavioral: Negative.  Negative for depression. The patient is not nervous/anxious.     Objective:   BP 118/68 (BP Location: Left Arm, Patient Position: Sitting, Cuff Size: Normal)   Pulse 78   Temp 98.1 F (36.7 C) (Temporal)   Ht 5\' 1"  (1.549 m)   Wt 138 lb (62.6 kg)   SpO2 98%   BMI 26.07 kg/m  Body mass index is 26.07 kg/m.   General Appearance:    Alert, cooperative, no distress, appears stated age  Head:    Normocephalic, without obvious abnormality, atraumatic  Eyes:    PERRL, conjunctiva/corneas clear, EOM's intact, fundi    benign, both eyes  Ears:    Normal TM's and external ear canals, both ears  Nose:   Nares normal, septum midline, mucosa normal, no drainage    or sinus tenderness  Throat:   Lips, mucosa, and tongue normal; teeth and gums normal  Neck:   Supple, symmetrical, trachea midline, no adenopathy;    thyroid:  no enlargement/tenderness/nodules; no carotid   bruit or JVD  Back:     Symmetric, no curvature, ROM normal, no CVA tenderness Pain with palpation to L side of lower thoracic paraspinal area  Lungs:     Clear to auscultation  bilaterally, respirations unlabored  Chest Wall:    No tenderness or deformity   Heart:    Regular rate and rhythm, S1 and S2 normal, no murmur, rub or gallop  Breast Exam:    Deferred  Abdomen:     Soft, non-tender, bowel sounds active all four quadrants,    no masses, no organomegaly  Genitalia:    Deferred  Extremities:   Extremities normal, atraumatic, no cyanosis or edema  Pulses:   2+ and symmetric all extremities  Skin:   Skin color, texture, turgor normal, no rashes or lesions  Lymph nodes:   Cervical, supraclavicular, and axillary nodes normal  Neurologic:   CNII-XII intact, normal strength, sensation and reflexes    throughout     Assessment/Plan:   Carol [name] was seen today for annual exam.  Diagnoses and all orders for this visit:  Encounter for general adult medical examination with abnormal findings Today patient counseled on age appropriate routine health concerns for screening and prevention, each reviewed and up to date or declined. Immunizations reviewed and up to date or declined. Labs ordered and reviewed. Risk factors for depression reviewed and negative. Hearing function and visual acuity are intact. ADLs screened and addressed as needed. Functional ability and level of safety reviewed and appropriate. Education, counseling and referrals performed based on assessed risks today. Patient provided with a copy of personalized plan for preventive services.  Acute left-sided thoracic back pain Will obtain xray for further evaluation given point tenderness. Further work-up based upon lab result. -     DG Thoracic Spine W/Swimmers; Future  Right sided abdominal pain No red flags on exam. Continue watchful waiting.  Neck pain Chronic pain. Sees pain management.  Hyperlipidemia, unspecified hyperlipidemia type Lipid panel is well controlled.  Osteopenia of multiple sites Update bone density. -     DG Bone Density; Future  Incontinence of feces, unspecified fecal  incontinence type Management per specialists.  Serum phosphate elevated Will update phosphorus. Recommend reduction in Vitamin D supplementation. Last kidney function panel WNL. -     Phosphorus; Future -     Phosphorus  Localization-related idiopathic epilepsy and epileptic syndromes with seizures of localized onset, not intractable, without status epilepticus ([place]) Management per neurology.    Well Adult Exam: Labs ordered: Yes. Patient counseling was done. See below for items discussed. Discussed the patient's BMI.  The BMI is in the acceptable range Follow up in one year. Breast cancer screening: pending. Cervical cancer screening: n/a   Patient Counseling: [x]    Nutrition: Stressed importance of moderation in sodium/caffeine intake, saturated fat and cholesterol, caloric balance, sufficient intake of fresh fruits, vegetables, fiber, calcium, iron, and 1 mg of folate supplement per day (for females capable of pregnancy).  [x]    Stressed the importance of regular exercise.   [x]    Substance Abuse: Discussed cessation/primary prevention of tobacco, alcohol, or other drug use; driving or other dangerous activities under the influence; availability of treatment for abuse.   [x]    Injury prevention: Discussed safety belts, safety helmets, smoke detector, smoking near bedding or upholstery.   [x]    Sexuality: Discussed sexually transmitted diseases, partner selection, use of condoms, avoidance of unintended pregnancy  and contraceptive alternatives.  [x]    Dental health: Discussed importance of regular tooth brushing, flossing, and dental visits.  [x]    Health maintenance and immunizations reviewed. Please refer to Health maintenance section.   CMA or LPN served as scribe during this visit. History, Physical, and Plan performed by medical provider. The above documentation has been reviewed and is accurate and complete.   Inda Coke, PA-C [place]

## 2020-04-27 NOTE — Patient Instructions (Addendum)
It was great to see you!  Please go to the lab for blood work.   Please schedule your DEXA on the way out.  An order for an xray has been put in for you. To get your xray, you can walk in at the [facility] location without a scheduled appointment. The address is [address]. It is across the street from [place] is located in the basement.  Hours of operation are M-F 8:30am to 5:00pm. Please note that they are closed for lunch between 12:30 and 1:00pm.  Our office will call you with your results unless you have chosen to receive results via MyChart.  If your blood work is normal we will follow-up each year for physicals and as scheduled for chronic medical problems.  If anything is abnormal we will treat accordingly and get you in for a follow-up.  Take care,  [facility] Maintenance, Female Adopting a healthy lifestyle and getting preventive care are important in promoting health and wellness. Ask your health care provider about:  The right schedule for you to have regular tests and exams.  Things you can do on your own to prevent diseases and keep yourself healthy. What should I know about diet, weight, and exercise? Eat a healthy diet   Eat a diet that includes plenty of vegetables, fruits, low-fat dairy products, and lean protein.  Do not eat a lot of foods that are high in solid fats, added sugars, or sodium. Maintain a healthy weight Body mass index (BMI) is used to identify weight problems. It estimates body fat based on height and weight. Your health care provider can help determine your BMI and help you achieve or maintain a healthy weight. Get regular exercise Get regular exercise. This is one of the most important things you can do for your health. Most adults should:  Exercise for at least 150 minutes each week. The exercise should increase your heart rate and make you sweat (moderate-intensity exercise).  Do strengthening  exercises at least twice a week. This is in addition to the moderate-intensity exercise.  Spend less time sitting. Even light physical activity can be beneficial. Watch cholesterol and blood lipids Have your blood tested for lipids and cholesterol at 72 years of age, then have this test every 5 years. Have your cholesterol levels checked more often if:  Your lipid or cholesterol levels are high.  You are older than 72 years of age.  You are at high risk for heart disease. What should I know about cancer screening? Depending on your health history and family history, you may need to have cancer screening at various ages. This may include screening for:  Breast cancer.  Cervical cancer.  Colorectal cancer.  Skin cancer.  Lung cancer. What should I know about heart disease, diabetes, and high blood pressure? Blood pressure and heart disease  High blood pressure causes heart disease and increases the risk of stroke. This is more likely to develop in people who have high blood pressure readings, are of African descent, or are overweight.  Have your blood pressure checked: ? Every 3-5 years if you are 27-28 years of age. ? Every year if you are 64 years old or older. Diabetes Have regular diabetes screenings. This checks your fasting blood sugar level. Have the screening done:  Once every three years after age 38 if you are at a normal weight and have a low risk for diabetes.  More often and at  a younger age if you are overweight or have a high risk for diabetes. What should I know about preventing infection? Hepatitis B If you have a higher risk for hepatitis B, you should be screened for this virus. Talk with your health care provider to find out if you are at risk for hepatitis B infection. Hepatitis C Testing is recommended for:  Everyone born from [birth year] through 1963.  Anyone with known risk factors for hepatitis C. Sexually transmitted infections (STIs)  Get screened  for STIs, including gonorrhea and chlamydia, if: ? You are sexually active and are younger than 72 years of age. ? You are older than 72 years of age and your health care provider tells you that you are at risk for this type of infection. ? Your sexual activity has changed since you were last screened, and you are at increased risk for chlamydia or gonorrhea. Ask your health care provider if you are at risk.  Ask your health care provider about whether you are at high risk for HIV. Your health care provider may recommend a prescription medicine to help prevent HIV infection. If you choose to take medicine to prevent HIV, you should first get tested for HIV. You should then be tested every 3 months for as long as you are taking the medicine. Pregnancy  If you are about to stop having your period (premenopausal) and you may become pregnant, seek counseling before you get pregnant.  Take 400 to 800 micrograms (mcg) of folic acid every day if you become pregnant.  Ask for birth control (contraception) if you want to prevent pregnancy. Osteoporosis and menopause Osteoporosis is a disease in which the bones lose minerals and strength with aging. This can result in bone fractures. If you are 80 years old or older, or if you are at risk for osteoporosis and fractures, ask your health care provider if you should:  Be screened for bone loss.  Take a calcium or vitamin D supplement to lower your risk of fractures.  Be given hormone replacement therapy (HRT) to treat symptoms of menopause. Follow these instructions at home: Lifestyle  Do not use any products that contain nicotine or tobacco, such as cigarettes, e-cigarettes, and chewing tobacco. If you need help quitting, ask your health care provider.  Do not use street drugs.  Do not share needles.  Ask your health care provider for help if you need support or information about quitting drugs. Alcohol use  Do not drink alcohol if: ? Your  health care provider tells you not to drink. ? You are pregnant, may be pregnant, or are planning to become pregnant.  If you drink alcohol: ? Limit how much you use to 0-1 drink a day. ? Limit intake if you are breastfeeding.  Be aware of how much alcohol is in your drink. In the [place] one drink equals one 12 oz bottle of beer (355 mL), one 5 oz glass of wine (148 mL), or one 1 oz glass of hard liquor (44 mL). General instructions  Schedule regular health, dental, and eye exams.  Stay current with your vaccines.  Tell your health care provider if: ? You often feel depressed. ? You have ever been abused or do not feel safe at home. Summary  Adopting a healthy lifestyle and getting preventive care are important in promoting health and wellness.  Follow your health care provider's instructions about healthy diet, exercising, and getting tested or screened for diseases.  Follow your health care provider's  instructions on monitoring your cholesterol and blood pressure. This information is not intended to replace advice given to you by your health care provider. Make sure you discuss any questions you have with your health care provider. Document Revised: 09/05/2018 Document Reviewed: 09/05/2018 Elsevier Patient Education  2020 [employer].

## 2020-04-28 LAB — PHOSPHORUS: Phosphorus: 3.9 mg/dL (ref 2.1–4.3)

## 2020-04-29 ENCOUNTER — Encounter: Payer: Self-pay | Admitting: Physician Assistant

## 2020-04-29 ENCOUNTER — Inpatient Hospital Stay: Admission: RE | Admit: 2020-04-29 | Payer: BC Managed Care – PPO | Source: Ambulatory Visit

## 2020-04-29 LAB — FREE THYROXINE INDEX: Free Thyroxine Index: 1.3

## 2020-04-29 LAB — T3 UPTAKE: T3 Uptake: 28

## 2020-04-29 LAB — T4: Thyroxine (T4): 4.5

## 2020-05-04 ENCOUNTER — Other Ambulatory Visit: Payer: Self-pay

## 2020-05-04 ENCOUNTER — Ambulatory Visit (INDEPENDENT_AMBULATORY_CARE_PROVIDER_SITE_OTHER)
Admission: RE | Admit: 2020-05-04 | Discharge: 2020-05-04 | Disposition: A | Discharge status: Home / Self Care | Payer: BC Managed Care – PPO | Source: Ambulatory Visit | Attending: Physician Assistant | Admitting: Physician Assistant

## 2020-05-04 DIAGNOSIS — M8589 Other specified disorders of bone density and structure, multiple sites: Secondary | ICD-10-CM | POA: Diagnosis not present

## 2020-05-06 ENCOUNTER — Ambulatory Visit: Payer: BC Managed Care – PPO | Admitting: Neurology

## 2020-05-12 LAB — HM MAMMOGRAPHY

## 2020-08-27 ENCOUNTER — Other Ambulatory Visit: Payer: BC Managed Care – PPO

## 2020-09-23 ENCOUNTER — Encounter: Payer: Self-pay | Admitting: Neurology

## 2020-09-23 ENCOUNTER — Other Ambulatory Visit: Payer: Self-pay

## 2020-09-23 ENCOUNTER — Ambulatory Visit: Payer: BC Managed Care – PPO | Admitting: Neurology

## 2020-09-23 VITALS — BP 139/67 | HR 74 | Resp 18 | Ht 61.0 in | Wt 135.0 lb

## 2020-09-23 DIAGNOSIS — G40009 Localization-related (focal) (partial) idiopathic epilepsy and epileptic syndromes with seizures of localized onset, not intractable, without status epilepticus: Secondary | ICD-10-CM

## 2020-09-23 MED ORDER — OXCARBAZEPINE 300 MG PO TABS: ORAL_TABLET | ORAL | 3 refills | Status: DC

## 2020-09-23 NOTE — Patient Instructions (Signed)
Always good to see you! Continue oxcarbazepine 300mg  twice a day. Follow-up in 6-8 months, call for any changes.   Seizure Precautions: 1. If medication has been prescribed for you to prevent seizures, take it exactly as directed.  Do not stop taking the medicine without talking to your doctor first, even if you have not had a seizure in a long time.   2. Avoid activities in which a seizure would cause danger to yourself or to others.  Don't operate dangerous machinery, swim alone, or climb in high or dangerous places, such as on ladders, roofs, or girders.  Do not drive unless your doctor says you may.  3. If you have any warning that you may have a seizure, lay down in a safe place where you can't hurt yourself.    4.  No driving for 6 months from last seizure, as per [facility].   Please refer to the following link on the Epilepsy Foundation of [employer]'s website for more information: http://www.epilepsyfoundation.org/answerplace/Social/driving/drivingu.cfm   5.  Maintain good sleep hygiene. Avoid alcohol.  6.  Contact your doctor if you have any problems that may be related to the medicine you are taking.  7.  Call 911 and bring the patient back to the ED if:        A.  The seizure lasts longer than 5 minutes.       B.  The patient doesn't awaken shortly after the seizure  C.  The patient has new problems such as difficulty seeing, speaking or moving  D.  The patient was injured during the seizure  E.  The patient has a temperature over 102 F (39C)  F.  The patient vomited and now is having trouble breathing
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
## 2020-10-15 DIAGNOSIS — M961 Postlaminectomy syndrome, not elsewhere classified: Secondary | ICD-10-CM | POA: Diagnosis not present

## 2020-10-15 DIAGNOSIS — M5481 Occipital neuralgia: Secondary | ICD-10-CM | POA: Diagnosis not present

## 2020-10-15 DIAGNOSIS — G894 Chronic pain syndrome: Secondary | ICD-10-CM | POA: Diagnosis not present

## 2020-11-16 ENCOUNTER — Encounter: Payer: Self-pay | Admitting: Physician Assistant

## 2020-12-14 DIAGNOSIS — R159 Full incontinence of feces: Secondary | ICD-10-CM | POA: Diagnosis not present

## 2021-01-07 DIAGNOSIS — M961 Postlaminectomy syndrome, not elsewhere classified: Secondary | ICD-10-CM | POA: Diagnosis not present

## 2021-01-07 DIAGNOSIS — G894 Chronic pain syndrome: Secondary | ICD-10-CM | POA: Diagnosis not present

## 2021-01-07 DIAGNOSIS — R51 Headache with orthostatic component, not elsewhere classified: Secondary | ICD-10-CM | POA: Diagnosis not present

## 2021-01-07 DIAGNOSIS — M5481 Occipital neuralgia: Secondary | ICD-10-CM | POA: Diagnosis not present

## 2021-03-31 DIAGNOSIS — H2513 Age-related nuclear cataract, bilateral: Secondary | ICD-10-CM | POA: Diagnosis not present

## 2021-03-31 DIAGNOSIS — Z0100 Encounter for examination of eyes and vision without abnormal findings: Secondary | ICD-10-CM | POA: Diagnosis not present

## 2021-03-31 DIAGNOSIS — H5213 Myopia, bilateral: Secondary | ICD-10-CM | POA: Diagnosis not present

## 2021-04-08 DIAGNOSIS — G894 Chronic pain syndrome: Secondary | ICD-10-CM | POA: Diagnosis not present

## 2021-04-08 DIAGNOSIS — M5481 Occipital neuralgia: Secondary | ICD-10-CM | POA: Diagnosis not present

## 2021-04-08 DIAGNOSIS — M961 Postlaminectomy syndrome, not elsewhere classified: Secondary | ICD-10-CM | POA: Diagnosis not present

## 2021-04-16 DIAGNOSIS — Z20822 Contact with and (suspected) exposure to covid-19: Secondary | ICD-10-CM | POA: Diagnosis not present

## 2021-04-18 DIAGNOSIS — L237 Allergic contact dermatitis due to plants, except food: Secondary | ICD-10-CM | POA: Diagnosis not present

## 2021-05-10 ENCOUNTER — Ambulatory Visit (INDEPENDENT_AMBULATORY_CARE_PROVIDER_SITE_OTHER): Payer: Medicare HMO | Admitting: Physician Assistant

## 2021-05-10 ENCOUNTER — Other Ambulatory Visit: Payer: Self-pay

## 2021-05-10 ENCOUNTER — Encounter: Payer: Self-pay | Admitting: Physician Assistant

## 2021-05-10 VITALS — BP 145/82 | HR 58 | Temp 98.3°F | Ht 61.0 in | Wt 126.2 lb

## 2021-05-10 DIAGNOSIS — Z0001 Encounter for general adult medical examination with abnormal findings: Secondary | ICD-10-CM | POA: Diagnosis not present

## 2021-05-10 DIAGNOSIS — Z13220 Encounter for screening for lipoid disorders: Secondary | ICD-10-CM

## 2021-05-10 DIAGNOSIS — M542 Cervicalgia: Secondary | ICD-10-CM | POA: Diagnosis not present

## 2021-05-10 DIAGNOSIS — Z131 Encounter for screening for diabetes mellitus: Secondary | ICD-10-CM | POA: Diagnosis not present

## 2021-05-10 DIAGNOSIS — E673 Hypervitaminosis D: Secondary | ICD-10-CM | POA: Diagnosis not present

## 2021-05-10 DIAGNOSIS — R159 Full incontinence of feces: Secondary | ICD-10-CM | POA: Diagnosis not present

## 2021-05-10 LAB — CBC WITH DIFFERENTIAL/PLATELET
Basophils Absolute: 0 10*3/uL (ref 0.0–0.1)
Basophils Relative: 0.7 % (ref 0.0–3.0)
Eosinophils Absolute: 0.1 10*3/uL (ref 0.0–0.7)
Eosinophils Relative: 2.2 % (ref 0.0–5.0)
HCT: 39.7 % (ref 36.0–46.0)
Hemoglobin: 13.5 g/dL (ref 12.0–15.0)
Lymphocytes Relative: 26.5 % (ref 12.0–46.0)
Lymphs Abs: 1.2 10*3/uL (ref 0.7–4.0)
MCHC: 33.9 g/dL (ref 30.0–36.0)
MCV: 88.8 fl (ref 78.0–100.0)
Monocytes Absolute: 0.3 10*3/uL (ref 0.1–1.0)
Monocytes Relative: 6.3 % (ref 3.0–12.0)
Neutro Abs: 2.8 10*3/uL (ref 1.4–7.7)
Neutrophils Relative %: 64.3 % (ref 43.0–77.0)
Platelets: 237 10*3/uL (ref 150.0–400.0)
RBC: 4.47 Mil/uL (ref 3.87–5.11)
RDW: 13.3 % (ref 11.5–15.5)
WBC: 4.4 10*3/uL (ref 4.0–10.5)

## 2021-05-10 LAB — LIPID PANEL
Cholesterol: 182 mg/dL (ref 0–200)
HDL: 69 mg/dL (ref >39.00)
LDL Cholesterol: 95 mg/dL (ref 0–99)
NonHDL: 113.02
Total CHOL/HDL Ratio: 3
Triglycerides: 92 mg/dL (ref 0.0–149.0)
VLDL: 18.4 mg/dL (ref 0.0–40.0)

## 2021-05-10 LAB — COMPREHENSIVE METABOLIC PANEL
ALT: 13 U/L (ref 0–35)
AST: 16 U/L (ref 0–37)
Albumin: 4.2 g/dL (ref 3.5–5.2)
Alkaline Phosphatase: 74 U/L (ref 39–117)
BUN: 10 mg/dL (ref 6–23)
CO2: 28 mEq/L (ref 19–32)
Calcium: 9.3 mg/dL (ref 8.4–10.5)
Chloride: 105 mEq/L (ref 96–112)
Creatinine, Ser: 0.82 mg/dL (ref 0.40–1.20)
GFR: 71.14 mL/min (ref >60.00)
Glucose, Bld: 97 mg/dL (ref 70–99)
Potassium: 4.5 mEq/L (ref 3.5–5.1)
Sodium: 141 mEq/L (ref 135–145)
Total Bilirubin: 0.5 mg/dL (ref 0.2–1.2)
Total Protein: 6.6 g/dL (ref 6.0–8.3)

## 2021-05-10 LAB — VITAMIN D 25 HYDROXY (VIT D DEFICIENCY, FRACTURES): VITD: 53.87 ng/mL (ref 30.00–100.00)

## 2021-05-10 NOTE — Patient Instructions (Addendum)
Good to meet you today! Please go to the lab for blood work and I will send results through [facility]. Referral sent for 2nd opinion colorectal surgeon. Talk with your pharmacist about Shingrix vaccine.  Keep up the good work taking care of yourself!   Call if any concerns

## 2021-05-10 NOTE — Progress Notes (Signed)
Established Patient Office Visit  Subjective:  Patient ID: Carol [name], female    DOB: [birth year]  Age: 75 y.o. MRN: [id number]  CC:  Chief Complaint  Patient presents with   Annual Exam    HPI Carol [name] presents for annual CPE. Denies any changes from last year's physical.   Acute concerns: Fecal incontinence - states that she had anal rectal manometry placed 10/09/19 and since then will have random explosions of brown liquid. She wants to have this removed because she says she was not having this issue prior to procedure. She wants referral for 2nd opinion.  Health maintenance: Lifestyle/ exercise: Walks 3.5 miles every morning, rides stationary bike 30 minutes daily Nutrition: Could be better, could be worse Mental health: Doing well Caffeine: Dr. Malachi Bonds daily Sleep: Dogs wake her up at 2 am and 5 am  Substance use: None  Immunizations: Flu shot in the fall  Mammogram: She is due for mammogram and will schedule this herself. DEXA: Oseopenia, 05/04/20, due again in 2023    Specialists: Dr. Hardin Negus for pain management every 4 months. She also has a neurologist Dr. Delice Lesch for seizure disorder (no issues in the last two years).   Past Medical History:  Diagnosis Date   Cervical spondylosis without myelopathy    Chronic neck pain    Fecal incontinence    History of asthma    per pt has issues with c[place] smoke, no issues since age 38's when smoking was ban inside   History of kidney stones    Idiopathic generalized epilepsy [facility]) neurologist---  dr Delice Lesch   new onset seizure april and august 2020;  dx 10/ 2020    (11-25-2019 pt stated last episode 01/ 19/ 2021 was noctural)   Mixed stress and urge urinary incontinence    Wears contact lenses     Past Surgical History:  Procedure Laterality Date   ANAL RECTAL MANOMETRY N/A 10/09/2019   Procedure: ANAL MANOMETRY;  Surgeon: Leighton Ruff, MD;  Location: [facility];  Service: Endoscopy;  Laterality: N/A;    DILATION AND CURETTAGE OF UTERUS     ELBOW SURGERY Right    NECK SURGERY     x  6 3 plates in neck   TOE SURGERY Left    x 4     Family History  Problem Relation Age of Onset   COPD Mother    COPD Father     Social History   Socioeconomic History   Marital status: Married    Spouse name: Not on file   Number of children: 2   Years of education: Not on file   Highest education level: Some college, no degree  Occupational History   Occupation: retired  Tobacco Use   Smoking status: Never   Smokeless tobacco: Never  Vaping Use   Vaping Use: Never used  Substance and Sexual Activity   Alcohol use: No   Drug use: Never   Sexual activity: Not Currently  Other Topics Concern   Not on file  Social History Narrative   Goes by Carol [name]   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 One am and one pm     Oxcarbazepine (TRILEPTAL) 300 MG tablet Take 1 tablet twice a day 180 tablet 3   traMADol (ULTRAM) 50 MG tablet Take 1 tablet (50 mg total) by mouth every 6 (six) hours as needed. 10 tablet 0   Vitamin D, Ergocalciferol, 2000 units CAPS Take 1 capsule by mouth daily.     butalbital-acetaminophen-caffeine (FIORICET, ESGIC) 50-325-40 MG tablet Take 1 tablet by mouth as needed.      No facility-administered medications prior to visit.    Allergies  Allergen Reactions   Fluoride Swelling   Other Rash    Lilac   Prunus Persica Hives    ROS Review of Systems   Constitutional:  Negative for activity change, appetite change, fever and unexpected weight change.  HENT:  Negative for congestion.   Eyes:  Negative for visual disturbance.  Respiratory:  Negative for apnea, cough and shortness of breath.   Cardiovascular:  Negative for chest pain, palpitations and leg swelling.  Gastrointestinal:  Positive for diarrhea. Negative for abdominal pain, blood in stool and constipation.  Endocrine: Negative for polydipsia, polyphagia and polyuria.  Genitourinary:  Negative for dysuria and pelvic pain.  Musculoskeletal:  Positive for neck pain. Negative for arthralgias.  Skin:  Negative for rash.  Neurological:  Negative for dizziness, weakness and headaches.  Hematological:  Negative for adenopathy. Does not bruise/bleed easily.  Psychiatric/Behavioral:  Negative for sleep disturbance and suicidal ideas. The patient is not nervous/anxious.      Objective:    Physical Exam Vitals and nursing note reviewed.  Constitutional:      General: She is not in acute distress.    Appearance: Normal appearance. She is normal weight.  HENT:     Head: Normocephalic.     Right Ear: External ear normal.     Left Ear: External ear normal.     Nose: Nose normal.     Mouth/Throat:     Mouth: Mucous membranes are moist.  Eyes:     Extraocular Movements: Extraocular movements intact.     Conjunctiva/sclera: Conjunctivae normal.     Pupils: Pupils are equal, round, and reactive to light.  Cardiovascular:     Rate and Rhythm: Normal rate and regular rhythm.     Pulses: Normal pulses.     Heart sounds: No murmur heard. Pulmonary:     Effort: Pulmonary effort is normal.     Breath sounds: Normal breath sounds.  Abdominal:     General: Abdomen is flat. Bowel sounds are normal.     Palpations: Abdomen is soft.     Tenderness: There is no abdominal tenderness.  Musculoskeletal:        General: Normal range of motion.     Cervical back: Normal range of motion.   Skin:    General: Skin is warm.     Comments: Seborrheic keratoses noted, large one on chest, reassured pt benign   Neurological:     General: No focal deficit present.     Mental Status: She is alert and oriented to person, place, and time.     Gait: Gait normal.  Psychiatric:        Mood and Affect: Mood normal.  Behavior: Behavior normal.    BP (!) 145/82   Pulse (!) 58   Temp 98.3 F (36.8 C)   Ht '5\' 1"'$  (1.549 m)   Wt 126 lb 3.2 oz (57.2 kg)   BMI 23.85 kg/m  Wt Readings from Last 3 Encounters:  05/10/21 126 lb 3.2 oz (57.2 kg)  09/23/20 135 lb (61.2 kg)  04/27/20 138 lb (62.6 kg)     Health Maintenance Due  Topic Date Due   Hepatitis C Screening  Never done   Zoster Vaccines- Shingrix (1 of 2) Never done   COVID-19 Vaccine (3 - Booster for Pfizer series) 04/27/2020   INFLUENZA VACCINE  04/26/2021    There are no preventive care reminders to display for this patient.  Lab Results  Component Value Date   TSH 2.33 03/31/2020   Lab Results  Component Value Date   WBC 4.0 03/31/2020   HGB 14.9 03/31/2020   HCT 43 03/31/2020   MCV 87.7 01/28/2019   PLT 302 03/31/2020   Lab Results  Component Value Date   NA 140 03/31/2020   K 5.1 03/31/2020   CO2 18 (L) 11/21/2019   GLUCOSE 102 (H) 11/21/2019   BUN 15 03/31/2020   CREATININE 1.0 03/31/2020   BILITOT 0.3 01/28/2019   ALKPHOS 114 03/31/2020   AST 14 03/31/2020   ALT 12 03/31/2020   PROT 6.5 01/28/2019   ALBUMIN 4.7 03/31/2020   CALCIUM 10.0 03/31/2020   ANIONGAP 13 11/21/2019   GFR 67.81 01/28/2019   Lab Results  Component Value Date   CHOL 172 03/31/2020   Lab Results  Component Value Date   HDL 60 03/31/2020   Lab Results  Component Value Date   LDLCALC 97 03/31/2020   Lab Results  Component Value Date   TRIG 82 03/31/2020   No results found for: CHOLHDL No results found for: HGBA1C    Assessment & Plan:   Problem List Items Addressed This Visit       Other   Neck  pain   Incontinence of feces   Relevant Orders   Ambulatory referral to Colorectal Surgery   Other Visit Diagnoses     Encounter for general adult medical examination with abnormal findings    -  Primary   Relevant Orders   CBC with Differential/Platelet   Comprehensive metabolic panel   High vitamin D level       Relevant Orders   VITAMIN D 25 Hydroxy (Vit-D Deficiency, Fractures)   Diabetes mellitus screening       Relevant Orders   Comprehensive metabolic panel   Screening for cholesterol level       Relevant Orders   Lipid panel       No orders of the defined types were placed in this encounter.   Follow-up: Return in about 1 year (around 05/10/2022) for fasting labs and CPE.   Plan: Age-appropriate screening and counseling performed today. Will check labs and call with results. Preventive measures discussed and printed in AVS for patient. Vit D level to be rechecked today as this was elevated last year. Referral placed for 2nd opinion colorectal surgeon. She will continue f/up with specialists. She will continue acupuncture for neck pain.   Dariush Mcnellis M Hisao Doo, PA-C

## 2021-05-11 ENCOUNTER — Telehealth: Payer: Self-pay | Admitting: Orthopaedic Surgery

## 2021-05-11 NOTE — Telephone Encounter (Signed)
Received call from patient needing dates of surgery as she is filing for life insurance. I referenced SRS. Dates of neck surgery given to patient 10/13/1997, 10/29/1999, 09/12/2001, 06/25/2007, 06/16/2008 & 07/22/2009. Patient was grateful we still had this information.

## 2021-05-17 ENCOUNTER — Encounter (HOSPITAL_COMMUNITY): Payer: Self-pay | Admitting: Nurse Practitioner

## 2021-05-17 ENCOUNTER — Other Ambulatory Visit: Payer: Self-pay

## 2021-05-17 ENCOUNTER — Ambulatory Visit (HOSPITAL_COMMUNITY)
Admission: RE | Admit: 2021-05-17 | Discharge: 2021-05-17 | Disposition: A | Discharge status: Home / Self Care | Payer: Medicare HMO | Source: Ambulatory Visit

## 2021-05-28 ENCOUNTER — Ambulatory Visit: Payer: Medicare HMO | Admitting: Neurology

## 2021-05-28 ENCOUNTER — Other Ambulatory Visit: Payer: Self-pay

## 2021-05-28 ENCOUNTER — Encounter: Payer: Self-pay | Admitting: Neurology

## 2021-05-28 VITALS — BP 174/81 | HR 72 | Ht 61.0 in | Wt 130.4 lb

## 2021-05-28 DIAGNOSIS — G40009 Localization-related (focal) (partial) idiopathic epilepsy and epileptic syndromes with seizures of localized onset, not intractable, without status epilepticus: Secondary | ICD-10-CM | POA: Diagnosis not present

## 2021-05-28 MED ORDER — OXCARBAZEPINE 300 MG PO TABS: ORAL_TABLET | ORAL | 3 refills | Status: DC
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
## 2021-06-21 DIAGNOSIS — Z1231 Encounter for screening mammogram for malignant neoplasm of breast: Secondary | ICD-10-CM | POA: Diagnosis not present

## 2021-07-07 DIAGNOSIS — M5481 Occipital neuralgia: Secondary | ICD-10-CM | POA: Diagnosis not present

## 2021-07-07 DIAGNOSIS — R519 Headache, unspecified: Secondary | ICD-10-CM | POA: Diagnosis not present

## 2021-07-07 DIAGNOSIS — M961 Postlaminectomy syndrome, not elsewhere classified: Secondary | ICD-10-CM | POA: Diagnosis not present

## 2021-07-07 DIAGNOSIS — G894 Chronic pain syndrome: Secondary | ICD-10-CM | POA: Diagnosis not present

## 2021-07-15 ENCOUNTER — Other Ambulatory Visit: Payer: Self-pay | Admitting: Gynecology

## 2021-07-15 DIAGNOSIS — R928 Other abnormal and inconclusive findings on diagnostic imaging of breast: Secondary | ICD-10-CM

## 2021-07-21 DIAGNOSIS — R928 Other abnormal and inconclusive findings on diagnostic imaging of breast: Secondary | ICD-10-CM | POA: Diagnosis not present

## 2021-07-21 DIAGNOSIS — R922 Inconclusive mammogram: Secondary | ICD-10-CM | POA: Diagnosis not present

## 2021-07-23 ENCOUNTER — Other Ambulatory Visit: Payer: Medicare HMO

## 2021-08-11 ENCOUNTER — Other Ambulatory Visit: Payer: Medicare HMO

## 2021-09-30 DIAGNOSIS — M5481 Occipital neuralgia: Secondary | ICD-10-CM | POA: Diagnosis not present

## 2021-09-30 DIAGNOSIS — R519 Headache, unspecified: Secondary | ICD-10-CM | POA: Diagnosis not present

## 2021-09-30 DIAGNOSIS — G894 Chronic pain syndrome: Secondary | ICD-10-CM | POA: Diagnosis not present

## 2021-09-30 DIAGNOSIS — M961 Postlaminectomy syndrome, not elsewhere classified: Secondary | ICD-10-CM | POA: Diagnosis not present

## 2021-11-24 ENCOUNTER — Telehealth: Payer: Self-pay | Admitting: Physician Assistant

## 2021-11-24 NOTE — Telephone Encounter (Signed)
Copied from [place] [id number]. Topic: Medicare AWV ?>> Nov 24, 2021 10:26 AM [name], [name] wrote: ?Reason for CRM: LVM 11/24/21 11/30/21 appt time change from 930am to 11am.  Please confirm appt change khc ?

## 2021-11-30 ENCOUNTER — Ambulatory Visit: Payer: Medicare HMO

## 2021-12-07 ENCOUNTER — Ambulatory Visit (INDEPENDENT_AMBULATORY_CARE_PROVIDER_SITE_OTHER): Payer: Medicare HMO

## 2021-12-07 ENCOUNTER — Other Ambulatory Visit: Payer: Self-pay

## 2021-12-07 DIAGNOSIS — Z0000 Encounter for general adult medical examination without abnormal findings: Secondary | ICD-10-CM | POA: Diagnosis not present

## 2021-12-07 DIAGNOSIS — Z1231 Encounter for screening mammogram for malignant neoplasm of breast: Secondary | ICD-10-CM | POA: Diagnosis not present

## 2021-12-07 NOTE — Progress Notes (Addendum)
Virtual Visit via Telephone Note ? ?I connected with  Carol [name] on 12/07/21 at  8:45 AM EDT by telephone and verified that I am speaking with the correct person using two identifiers. ? ?Medicare Annual Wellness visit completed telephonically due to Covid-19 pandemic.  ? ?Persons participating in this call: This Health Coach and this patient.  ? ?Location: ?Patient: Home ?Provider: Office ?  ?I discussed the limitations, risks, security and privacy concerns of performing an evaluation and management service by telephone and the availability of in person appointments. The patient expressed understanding and agreed to proceed. ? ?Unable to perform video visit due to video visit attempted and failed and/or patient does not have video capability.  ? ?Some vital signs may be absent or patient reported.  ? ?Willette Brace, LPN ? ? ?Subjective:  ? Carol [name] is a 76 y.o. female who presents for an Initial Medicare Annual Wellness Visit. ? ?Review of Systems    ? ?Cardiac Risk Factors include: advanced age (>110mn, >>80women);dyslipidemia ? ?   ?Objective:  ?  ?There were no vitals filed for this visit. ?There is no height or weight on file to calculate BMI. ? ?Advanced Directives 12/07/2021 05/28/2021 09/23/2020 03/17/2020 11/29/2019 11/21/2019 07/19/2019  ?Does Patient Have a Medical Advance Directive? Yes Yes No No No No No  ?Type of [place];Out of facility DNR (pink MOST or yellow form) - - - - -  ?Copy of H[place]in Chart? No - copy requested - - - - - -  ?Would patient like information on creating a medical advance directive? - - - - Yes (MAU/Ambulatory/Procedural Areas - Information given) Yes (MAU/Ambulatory/Procedural Areas - Information given) -  ? ? ?Current Medications (verified) ?Outpatient Encounter Medications as of 12/07/2021  ?Medication Sig  ? GABAPENTIN PO Take 300 mg by mouth 2 (two) times  daily. One am and one pm  ? Oxcarbazepine (TRILEPTAL) 300 MG tablet Take 1 tablet twice a day  ? traMADol (ULTRAM) 50 MG tablet Take 1 tablet (50 mg total) by mouth every 6 (six) hours as needed.  ? PFIZER COVID-19 VAC BIVALENT injection   ? [DISCONTINUED] butalbital-acetaminophen-caffeine (FIORICET) 50-325-40 MG tablet Take by mouth 2 (two) times daily as needed for headache.  ? ?No facility-administered encounter medications on file as of 12/07/2021.  ? ? ?Allergies (verified) ?Fluoride, Other, and Prunus persica  ? ?History: ?Past Medical History:  ?Diagnosis Date  ? Cervical spondylosis without myelopathy   ? Chronic neck pain   ? Fecal incontinence   ? History of asthma   ? per pt has issues with [place] smoke, no issues since age 76'swhen smoking was ban inside  ? History of kidney stones   ? Idiopathic generalized epilepsy ([facility] neurologist---  dr aDelice Lesch ? new onset seizure april and august 2020;  dx 10/ 2020    (11-25-2019 pt stated last episode 01/ 19/ 2021 was noctural)  ? Mixed stress and urge urinary incontinence   ? Wears contact lenses   ? ?Past Surgical History:  ?Procedure Laterality Date  ? ANAL RECTAL MANOMETRY N/A 10/09/2019  ? Procedure: ANAL MANOMETRY;  Surgeon: TLeighton Ruff MD;  Location: [facility];  Service: Endoscopy;  Laterality: N/A;  ? DILATION AND CURETTAGE OF UTERUS    ? ELBOW SURGERY Right   ? NECK SURGERY    ? x  6 3 plates in neck  ? TOE SURGERY Left   ?  x 4   ? ?Family History  ?Problem Relation Age of Onset  ? COPD Mother   ? COPD Father   ? ?Social History  ? ?Socioeconomic History  ? Marital status: Married  ?  Spouse name: Not on file  ? Number of children: 2  ? Years of education: Not on file  ? Highest education level: Some college, no degree  ?Occupational History  ? Occupation: retired  ?Tobacco Use  ? Smoking status: Never  ? Smokeless tobacco: Never  ?Vaping Use  ? Vaping Use: Never used  ?Substance and Sexual Activity  ? Alcohol use: No  ? Drug use: Never  ? Sexual  activity: Not Currently  ?Other Topics Concern  ? Not on file  ?Social History Narrative  ? Goes by [name]  ? 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 Married for 18 years  ? 2 boys, 44 and 45 --> both live in [place]  ? Two story home  ?   ? Right handed   ? ?Social Determinants of Health  ? ?Financial Resource Strain: Low Risk   ? Difficulty of Paying Living Expenses: Not hard at all  ?Food Insecurity: No Food Insecurity  ? Worried About Charity fundraiser in the Last Year: Never true  ? Ran Out of Food in the Last Year: Never true  ?Transportation Needs: No Transportation Needs  ? Lack of Transportation (Medical): No  ? Lack of Transportation (Non-Medical): No  ?Physical Activity: Sufficiently Active  ? Days of Exercise per Week: 7 days  ? Minutes of Exercise per Session: 120 min  ?Stress: No Stress Concern Present  ? Feeling of Stress : Not at all  ?Social Connections: Moderately Isolated  ? Frequency of Communication with Friends and Family: More than three times a week  ? Frequency of Social Gatherings with Friends and Family: More than three times a week  ? Attends Religious Services: Never  ? Active Member of Clubs or Organizations: No  ? Attends Archivist Meetings: Never  ? Marital Status: Married  ? ? ?Tobacco Counseling ?Counseling given: Not Answered ? ? ?Clinical Intake: ? ?Pre-visit preparation completed: Yes ? ?Pain : 0-10 ?Pain Type: Chronic pain ?Pain Location: Neck ? ?  ? ?BMI - recorded: 24.65 ?Diabetes: No ? ?How often do you need to have someone help you when you read instructions, pamphlets, or other written materials from your doctor or pharmacy?: 1 - Never ? ?Diabetic?No ? ?Interpreter Needed?: No ? ?Information entered by :: Charlott Rakes, LPN ? ? ?Activities of Daily Living ?In your present state of health, do you have any difficulty performing the following activities: 12/07/2021  ?Hearing? N  ?Vision? N  ?Difficulty concentrating or making decisions? N  ?Walking  or climbing stairs? N  ?Dressing or bathing? N  ?Doing errands, shopping? N  ?Preparing Food and eating ? N  ?Using the Toilet? N  ?In the past six months, have you accidently leaked urine? N  ?Do you have problems with loss of bowel control? Y  ?Comment wears a brief at times for accidents  ?Managing your Medications? N  ?Managing your Finances? N  ?Housekeeping or managing your Housekeeping? N  ?Some recent data might be hidden  ? ? ?Patient Care Team: ?Inda Coke, [place] as PCP - General (Physician Assistant) ?Cameron Sprang, MD as Consulting Physician (Neurology) ? ?Indicate any recent Medical Services you may have received from other than [facility] providers in the  past year (date may be approximate). ? ?   ?Assessment:  ? This is a routine wellness examination for Jadie. ? ?Hearing/Vision screen ?Hearing Screening - Comments:: Pt denies any hearing isues ?Vision Screening - Comments:: Pt follows up with Dr Sabra Heck for annual eye exams  ? ?Dietary issues and exercise activities discussed: ?Current Exercise Habits: Home exercise routine, Type of exercise: walking;Other - see comments, Time (Minutes): > 60, Frequency (Times/Week): 7, Weekly Exercise (Minutes/Week): 0 ? ? Goals Addressed   ? ?  ?  ?  ?  ? This Visit's Progress  ?  Patient Stated     ?  Keep healthy and have weight down ?  ? ?  ? ?Depression Screen ?PHQ 2/9 Scores 12/07/2021 05/10/2021 04/27/2020 05/01/2018 05/16/2017  ?PHQ - 2 Score 0 0 0 1 0  ?PHQ- 9 Score - 0 - - -  ?  ?Fall Risk ?Fall Risk  12/07/2021 05/28/2021 09/23/2020 03/17/2020 10/15/2019  ?Falls in the past year? 0 0 0 0 1  ?Number falls in past yr: 0 0 0 0 0  ?Injury with Fall? 0 0 0 0 0  ?Risk for fall due to : Impaired vision - - - Mental status change  ?Follow up Falls prevention discussed - - - Falls evaluation completed  ? ? ?FALL RISK PREVENTION PERTAINING TO THE HOME: ? ?Any stairs in or around the home? Yes  ?If so, are there any without handrails? No  ?Home free of loose throw rugs in  walkways, pet beds, electrical cords, etc? Yes  ?Adequate lighting in your home to reduce risk of falls? Yes  ? ?ASSISTIVE DEVICES UTILIZED TO PREVENT FALLS: ? ?Life alert? No  ?Use of a cane, walker or w/c?

## 2021-12-07 NOTE — Patient Instructions (Addendum)
Ms. Menton , ?Thank you for taking time to come for your Medicare Wellness Visit. I appreciate your ongoing commitment to your health goals. Please review the following plan we discussed and let me know if I can assist you in the future.  ? ?Screening recommendations/referrals: ?Colonoscopy: Done 03/17/14 repeat every 10 years  ?Mammogram: Order placed 12/07/21 ?Bone Density: Done 05/04/20 repeat every 2 years  ?Recommended yearly ophthalmology/optometry visit for glaucoma screening and checkup ?Recommended yearly dental visit for hygiene and checkup ? ?Vaccinations: ?Influenza vaccine: Done 04/08/21 repeat every year ?Pneumococcal vaccine: Up to date ?Tdap vaccine: Done 05/16/17 repeat every 10 years  ?Shingles vaccine: pt stated completed and will upload mychart ?Covid-19:Completed 2/5, 11/26/19 & 06/30/21 ? ?Advanced directives: Please bring a copy of your health care power of attorney and living will to the office at your convenience. ? ?Conditions/risks identified: None at this time  ? ?Next appointment: Follow up in one year for your annual wellness visit  ? ? ?Preventive Care 62 Years and Older, Female ?Preventive care refers to lifestyle choices and visits with your health care provider that can promote health and wellness. ?What does preventive care include? ?A yearly physical exam. This is also called an annual well check. ?Dental exams once or twice a year. ?Routine eye exams. Ask your health care provider how often you should have your eyes checked. ?Personal lifestyle choices, including: ?Daily care of your teeth and gums. ?Regular physical activity. ?Eating a healthy diet. ?Avoiding tobacco and drug use. ?Limiting alcohol use. ?Practicing safe sex. ?Taking low-dose aspirin every day. ?Taking vitamin and mineral supplements as recommended by your health care provider. ?What happens during an annual well check? ?The services and screenings done by your health care provider during your annual well check will  depend on your age, overall health, lifestyle risk factors, and family history of disease. ?Counseling  ?Your health care provider may ask you questions about your: ?Alcohol use. ?Tobacco use. ?Drug use. ?Emotional well-being. ?Home and relationship well-being. ?Sexual activity. ?Eating habits. ?History of falls. ?Memory and ability to understand (cognition). ?Work and work Statistician. ?Reproductive health. ?Screening  ?You may have the following tests or measurements: ?Height, weight, and BMI. ?Blood pressure. ?Lipid and cholesterol levels. These may be checked every 5 years, or more frequently if you are over 65 years old. ?Skin check. ?Lung cancer screening. You may have this screening every year starting at age 34 if you have a 30-pack-year history of smoking and currently smoke or have quit within the past 15 years. ?Fecal occult blood test (FOBT) of the stool. You may have this test every year starting at age 57. ?Flexible sigmoidoscopy or colonoscopy. You may have a sigmoidoscopy every 5 years or a colonoscopy every 10 years starting at age 73. ?Hepatitis C blood test. ?Hepatitis B blood test. ?Sexually transmitted disease (STD) testing. ?Diabetes screening. This is done by checking your blood sugar (glucose) after you have not eaten for a while (fasting). You may have this done every 1-3 years. ?Bone density scan. This is done to screen for osteoporosis. You may have this done starting at age 56. ?Mammogram. This may be done every 1-2 years. Talk to your health care provider about how often you should have regular mammograms. ?Talk with your health care provider about your test results, treatment options, and if necessary, the need for more tests. ?Vaccines  ?Your health care provider may recommend certain vaccines, such as: ?Influenza vaccine. This is recommended every year. ?Tetanus, diphtheria, and acellular  pertussis (Tdap, Td) vaccine. You may need a Td booster every 10 years. ?Zoster vaccine. You may  need this after age 33. ?Pneumococcal 13-valent conjugate (PCV13) vaccine. One dose is recommended after age 51. ?Pneumococcal polysaccharide (PPSV23) vaccine. One dose is recommended after age 50. ?Talk to your health care provider about which screenings and vaccines you need and how often you need them. ?This information is not intended to replace advice given to you by your health care provider. Make sure you discuss any questions you have with your health care provider. ?Document Released: 10/09/2015 Document Revised: 06/01/2016 Document Reviewed: 07/14/2015 ?Elsevier Interactive Patient Education ? 2017 [place]. ? ?Fall Prevention in the Home ?Falls can cause injuries. They can happen to people of all ages. There are many things you can do to make your home safe and to help prevent falls. ?What can I do on the outside of my home? ?Regularly fix the edges of walkways and driveways and fix any cracks. ?Remove anything that might make you trip as you walk through a door, such as a raised step or threshold. ?Trim any bushes or trees on the path to your home. ?Use bright outdoor lighting. ?Clear any walking paths of anything that might make someone trip, such as rocks or tools. ?Regularly check to see if handrails are loose or broken. Make sure that both sides of any steps have handrails. ?Any raised decks and porches should have guardrails on the edges. ?Have any leaves, snow, or ice cleared regularly. ?Use sand or salt on walking paths during winter. ?Clean up any spills in your garage right away. This includes oil or grease spills. ?What can I do in the bathroom? ?Use night lights. ?Install grab bars by the toilet and in the tub and shower. Do not use towel bars as grab bars. ?Use non-skid mats or decals in the tub or shower. ?If you need to sit down in the shower, use a plastic, non-slip stool. ?Keep the floor dry. Clean up any water that spills on the floor as soon as it happens. ?Remove soap buildup in  the tub or shower regularly. ?Attach bath mats securely with double-sided non-slip rug tape. ?Do not have throw rugs and other things on the floor that can make you trip. ?What can I do in the bedroom? ?Use night lights. ?Make sure that you have a light by your bed that is easy to reach. ?Do not use any sheets or blankets that are too big for your bed. They should not hang down onto the floor. ?Have a firm chair that has side arms. You can use this for support while you get dressed. ?Do not have throw rugs and other things on the floor that can make you trip. ?What can I do in the kitchen? ?Clean up any spills right away. ?Avoid walking on wet floors. ?Keep items that you use a lot in easy-to-reach places. ?If you need to reach something above you, use a strong step stool that has a grab bar. ?Keep electrical cords out of the way. ?Do not use floor polish or wax that makes floors slippery. If you must use wax, use non-skid floor wax. ?Do not have throw rugs and other things on the floor that can make you trip. ?What can I do with my stairs? ?Do not leave any items on the stairs. ?Make sure that there are handrails on both sides of the stairs and use them. Fix handrails that are broken or loose. Make sure that handrails  are as long as the stairways. ?Check any carpeting to make sure that it is firmly attached to the stairs. Fix any carpet that is loose or worn. ?Avoid having throw rugs at the top or bottom of the stairs. If you do have throw rugs, attach them to the floor with carpet tape. ?Make sure that you have a light switch at the top of the stairs and the bottom of the stairs. If you do not have them, ask someone to add them for you. ?What else can I do to help prevent falls? ?Wear shoes that: ?Do not have high heels. ?Have rubber bottoms. ?Are comfortable and fit you well. ?Are closed at the toe. Do not wear sandals. ?If you use a stepladder: ?Make sure that it is fully opened. Do not climb a closed  stepladder. ?Make sure that both sides of the stepladder are locked into place. ?Ask someone to hold it for you, if possible. ?Clearly mark and make sure that you can see: ?Any grab bars or handrails. ?First and last

## 2021-12-27 DIAGNOSIS — R519 Headache, unspecified: Secondary | ICD-10-CM | POA: Diagnosis not present

## 2021-12-27 DIAGNOSIS — G894 Chronic pain syndrome: Secondary | ICD-10-CM | POA: Diagnosis not present

## 2021-12-27 DIAGNOSIS — M961 Postlaminectomy syndrome, not elsewhere classified: Secondary | ICD-10-CM | POA: Diagnosis not present

## 2021-12-27 DIAGNOSIS — Z79891 Long term (current) use of opiate analgesic: Secondary | ICD-10-CM | POA: Diagnosis not present

## 2021-12-27 DIAGNOSIS — M5481 Occipital neuralgia: Secondary | ICD-10-CM | POA: Diagnosis not present

## 2022-01-05 DIAGNOSIS — L814 Other melanin hyperpigmentation: Secondary | ICD-10-CM | POA: Diagnosis not present

## 2022-01-05 DIAGNOSIS — D225 Melanocytic nevi of trunk: Secondary | ICD-10-CM | POA: Diagnosis not present

## 2022-01-05 DIAGNOSIS — L821 Other seborrheic keratosis: Secondary | ICD-10-CM | POA: Diagnosis not present

## 2022-01-05 DIAGNOSIS — L738 Other specified follicular disorders: Secondary | ICD-10-CM | POA: Diagnosis not present

## 2022-01-05 DIAGNOSIS — L918 Other hypertrophic disorders of the skin: Secondary | ICD-10-CM | POA: Diagnosis not present

## 2022-02-14 ENCOUNTER — Ambulatory Visit: Payer: Medicare HMO | Admitting: Physician Assistant

## 2022-02-14 ENCOUNTER — Encounter (HOSPITAL_COMMUNITY): Payer: Self-pay

## 2022-02-14 ENCOUNTER — Emergency Department (HOSPITAL_COMMUNITY)
Admission: EM | Admit: 2022-02-14 | Discharge: 2022-02-14 | Disposition: A | Discharge status: Home / Self Care | Payer: Medicare HMO | Attending: Emergency Medicine | Admitting: Emergency Medicine

## 2022-02-14 ENCOUNTER — Emergency Department (HOSPITAL_COMMUNITY): Payer: Medicare HMO

## 2022-02-14 ENCOUNTER — Telehealth: Payer: Self-pay | Admitting: *Deleted

## 2022-02-14 DIAGNOSIS — R0789 Other chest pain: Secondary | ICD-10-CM | POA: Diagnosis not present

## 2022-02-14 DIAGNOSIS — R200 Anesthesia of skin: Secondary | ICD-10-CM | POA: Diagnosis not present

## 2022-02-14 DIAGNOSIS — R079 Chest pain, unspecified: Secondary | ICD-10-CM | POA: Insufficient documentation

## 2022-02-14 DIAGNOSIS — M546 Pain in thoracic spine: Secondary | ICD-10-CM | POA: Insufficient documentation

## 2022-02-14 DIAGNOSIS — R202 Paresthesia of skin: Secondary | ICD-10-CM | POA: Diagnosis not present

## 2022-02-14 DIAGNOSIS — R29818 Other symptoms and signs involving the nervous system: Secondary | ICD-10-CM | POA: Diagnosis not present

## 2022-02-14 LAB — CBC WITH DIFFERENTIAL/PLATELET
Abs Immature Granulocytes: 0.01 10*3/uL (ref 0.00–0.07)
Basophils Absolute: 0 10*3/uL (ref 0.0–0.1)
Basophils Relative: 1 %
Eosinophils Absolute: 0.1 10*3/uL (ref 0.0–0.5)
Eosinophils Relative: 1 %
HCT: 39.3 % (ref 36.0–46.0)
Hemoglobin: 13.6 g/dL (ref 12.0–15.0)
Immature Granulocytes: 0 %
Lymphocytes Relative: 33 %
Lymphs Abs: 1.6 10*3/uL (ref 0.7–4.0)
MCH: 31.1 pg (ref 26.0–34.0)
MCHC: 34.6 g/dL (ref 30.0–36.0)
MCV: 89.7 fL (ref 80.0–100.0)
Monocytes Absolute: 0.3 10*3/uL (ref 0.1–1.0)
Monocytes Relative: 7 %
Neutro Abs: 2.8 10*3/uL (ref 1.7–7.7)
Neutrophils Relative %: 58 %
Platelets: 227 10*3/uL (ref 150–400)
RBC: 4.38 MIL/uL (ref 3.87–5.11)
RDW: 11.9 % (ref 11.5–15.5)
WBC: 4.8 10*3/uL (ref 4.0–10.5)
nRBC: 0 % (ref 0.0–0.2)

## 2022-02-14 LAB — BASIC METABOLIC PANEL
Anion gap: 7 (ref 5–15)
BUN: 16 mg/dL (ref 8–23)
CO2: 22 mmol/L (ref 22–32)
Calcium: 8.6 mg/dL — ABNORMAL LOW (ref 8.9–10.3)
Chloride: 110 mmol/L (ref 98–111)
Creatinine, Ser: 0.82 mg/dL (ref 0.44–1.00)
GFR, Estimated: >60 mL/min (ref >60)
Glucose, Bld: 102 mg/dL — ABNORMAL HIGH (ref 70–99)
Potassium: 4.2 mmol/L (ref 3.5–5.1)
Sodium: 139 mmol/L (ref 135–145)

## 2022-02-14 LAB — TROPONIN I (HIGH SENSITIVITY)
Troponin I (High Sensitivity): 2 ng/L (ref <18)
Troponin I (High Sensitivity): 2 ng/L (ref <18)

## 2022-02-14 MED ORDER — IOHEXOL 350 MG/ML SOLN
100.0000 mL | Freq: Once | INTRAVENOUS | Status: AC | PRN
  Administered 2022-02-14: 100 mL via INTRAVENOUS

## 2022-02-14 NOTE — Telephone Encounter (Signed)
Spoke to pt told her per Aldona Bar need to go to the ED to be evaluated for possible stoke. Pt verbalized understanding and said she would rather come here. Told pt you will need a STAT CT scan and we can not do that here to evaluate for stroke. Told pt need to go to [facility] or [place]. Pt verbalized understanding.

## 2022-02-14 NOTE — ED Triage Notes (Signed)
Pt arrived via POV, c/o sternal chest pain that radiates to right arm since Saturday night. Pain is resolved currently, but states she wanted to be checked out. States she has had episodes of chest pain x4 times in the last couple months, but has not been seen for it. Denies any SOB.

## 2022-02-14 NOTE — Discharge Instructions (Addendum)
Follow-up with the cardiologist and neurologist as discussed.  Return to emergency room if you have any worsening symptoms.

## 2022-02-14 NOTE — ED Provider Notes (Signed)
[facility] DEPT Provider Note   CSN: [id number] Arrival date & time: 02/14/22  2094     History  Chief Complaint  Patient presents with   Chest Pain    Carol [name] is a 76 y.o. female.  Patient is a 76 year old female who presents with chest pain.  She states that over the last several weeks she has had some episodes of chest pain.  She describes as intense pain in the center of her chest.  The last episode was 2 days ago during the evening.  She says its not brought on with exertion.  Not related to eating.  No associated shortness of breath.  She said with this last episode she had some pain in her right arm and tingling in her right arm.  She says the pain has resolved but she still has some numbness in her right arm.  She denies any weakness in the arm that she can identify.  She said that she walks every morning about 3 miles.  On Saturday the day that she had the chest pain, she did experience some pain in her upper back.  This happened while she was walking and would go away with rest.  She has walked the last 2 days without any of those symptoms.  She denies any numbness or weakness to her other extremities.  No speech deficits or vision changes.  No known history of prior heart disease.      Home Medications Prior to Admission medications   Medication Sig Start Date End Date Taking? Authorizing Provider  GABAPENTIN PO Take 300 mg by mouth 2 (two) times daily. One am and one pm    [provider]  Oxcarbazepine (TRILEPTAL) 300 MG tablet Take 1 tablet twice a day 05/28/21   Cameron Sprang, MD  PFIZER COVID-19 [facility] BIVALENT injection  06/30/21   [provider]  traMADol (ULTRAM) 50 MG tablet Take 1 tablet (50 mg total) by mouth every 6 (six) hours as needed. 7/0/96   Leighton Ruff, MD      Allergies    Fluoride, Other, and Prunus persica    Review of Systems   Review of Systems  Constitutional:  Negative for chills,  diaphoresis, fatigue and fever.  HENT:  Negative for congestion, rhinorrhea and sneezing.   Eyes: Negative.   Respiratory:  Negative for cough, chest tightness and shortness of breath.   Cardiovascular:  Positive for chest pain. Negative for leg swelling.  Gastrointestinal:  Negative for abdominal pain, blood in stool, diarrhea, nausea and vomiting.  Genitourinary:  Negative for difficulty urinating, flank pain, frequency and hematuria.  Musculoskeletal:  Negative for arthralgias and back pain.  Skin:  Negative for rash.  Neurological:  Positive for numbness. Negative for dizziness, speech difficulty, weakness and headaches.   Physical Exam Updated Vital Signs BP (!) 142/72   Pulse 72   Temp 97.8 F (36.6 C) (Oral)   Resp 11   Ht '5\' 1"'$  (1.549 m)   Wt 54.4 kg   SpO2 99%   BMI 22.67 kg/m  Physical Exam Constitutional:      Appearance: She is well-developed.  HENT:     Head: Normocephalic and atraumatic.  Eyes:     Pupils: Pupils are equal, round, and reactive to light.  Cardiovascular:     Rate and Rhythm: Normal rate and regular rhythm.     Heart sounds: Normal heart sounds.  Pulmonary:     Effort: Pulmonary effort is normal.  No respiratory distress.     Breath sounds: Normal breath sounds. No wheezing or rales.  Chest:     Chest wall: No tenderness.  Abdominal:     General: Bowel sounds are normal.     Palpations: Abdomen is soft.     Tenderness: There is no abdominal tenderness. There is no guarding or rebound.  Musculoskeletal:        General: Normal range of motion.     Cervical back: Normal range of motion and neck supple.     Comments: No edema or calf tenderness  Lymphadenopathy:     Cervical: No cervical adenopathy.  Skin:    General: Skin is warm and dry.     Findings: No rash.  Neurological:     Mental Status: She is alert and oriented to person, place, and time.     Comments: Patient does have some circumferential numbness to her right arm as compared to  her left.  It is throughout the whole arm.  She does not have any weakness in her extremities.  She has good strength in all 4 extremities.  Cranial nerves II through XII grossly intact.    ED Results / Procedures / Treatments   Labs (all labs ordered are listed, but only abnormal results are displayed) Labs Reviewed  BASIC METABOLIC PANEL - Abnormal; Notable for the following components:      Result Value   Glucose, Bld 102 (*)    Calcium 8.6 (*)    All other components within normal limits  CBC WITH DIFFERENTIAL/PLATELET  TROPONIN I (HIGH SENSITIVITY)  TROPONIN I (HIGH SENSITIVITY)    EKG EKG Interpretation  Date/Time:  Monday Feb 14 2022 10:00:05 EDT Ventricular Rate:  79 PR Interval:  143 QRS Duration: 100 QT Interval:  371 QTC Calculation: 426 R Axis:   35 Text Interpretation: Sinus rhythm Low voltage, precordial leads since last tracing no significant change Confirmed by Malvin Johns [phone]) on 02/14/2022 11:05:20 AM  Radiology CT Head Wo Contrast  Result Date: 02/14/2022 CLINICAL DATA:  Neuro deficit.  Concern for stroke. EXAM: CT HEAD WITHOUT CONTRAST TECHNIQUE: Contiguous axial images were obtained from the base of the skull through the vertex without intravenous contrast. RADIATION DOSE REDUCTION: This exam was performed according to the departmental dose-optimization program which includes automated exposure control, adjustment of the mA and/or kV according to patient size and/or use of iterative reconstruction technique. COMPARISON:  None Available. FINDINGS: Brain: Minimal right basilar ganglia calcifications. Gray-white differentiation is otherwise well maintained. No CT evidence of acute large territory infarct. No intraparenchymal or extra-axial mass or hemorrhage. Normal size and configuration of the ventricles and the basilar cisterns. No midline shift. Vascular: No hyperdense vessel or unexpected calcification. Skull: No displaced calvarial fractures.  Sinuses/Orbits: Limited visualization the paranasal sinuses and mastoid air cells is normal. No air-fluid levels. Other: Regional soft tissues appear normal. IMPRESSION: Negative noncontrast head CT. Electronically Signed   By: Sandi Mariscal M.D.   On: 02/14/2022 12:27   CT Angio Chest Aorta w/CM &/OR wo/CM  Result Date: 02/14/2022 CLINICAL DATA:  Sternal chest pain for 2 days. Acute aortic syndrome (AAS) suspected EXAM: CT ANGIOGRAPHY CHEST WITH CONTRAST TECHNIQUE: Multidetector CT imaging of the chest was performed using the standard protocol during bolus administration of intravenous contrast. Multiplanar CT image reconstructions and MIPs were obtained to evaluate the vascular anatomy. RADIATION DOSE REDUCTION: This exam was performed according to the departmental dose-optimization program which includes automated exposure control, adjustment of the mA  and/or kV according to patient size and/or use of iterative reconstruction technique. CONTRAST:  153m OMNIPAQUE IOHEXOL 350 MG/ML SOLN COMPARISON:  None Available. FINDINGS: Cardiovascular: Normal heart size. No significant pericardial effusion/thickening. Normal course and caliber of the thoracic aorta. No thoracic aortic dissection, pseudoaneurysm, penetrating atherosclerotic ulcer or acute intramural hematoma. Aortic arch branch vessels are patent. Normal caliber pulmonary arteries. No central pulmonary emboli. Mediastinum/Nodes: No discrete thyroid nodules. Unremarkable esophagus. No pathologically enlarged axillary, mediastinal or hilar lymph nodes. Lungs/Pleura: No pneumothorax. No pleural effusion. No acute consolidative airspace disease, lung masses or significant pulmonary nodules. Upper abdomen: Simple 5.3 cm lateral upper left renal cyst, for which no follow-up is recommended. Musculoskeletal: No aggressive appearing focal osseous lesions. Mild thoracic spondylosis. Review of the MIP images confirms the above findings. IMPRESSION: No acute abnormality  in the chest. No evidence of acute aortic syndrome. Electronically Signed   By: JIlona SorrelM.D.   On: 02/14/2022 12:33    Procedures Procedures    Medications Ordered in ED Medications  iohexol (OMNIPAQUE) 350 MG/ML injection 100 mL (100 mLs Intravenous Contrast Given 02/14/22 1157)    ED Course/ Medical Decision Making/ A&P                           Medical Decision Making Amount and/or Complexity of Data Reviewed Labs: ordered. Radiology: ordered.  Risk Prescription drug management.   Patient is a 76year old female who presents with chest pain.  It does not seem to be exertional although she did have some associated upper back pain which did have an exertional component 2 days ago.  Although she has not had any ongoing symptoms even with exertion over the last 2 days.  She also had some numbness in her right arm.  No other strokelike symptoms.  No other neurologic deficits.  Given her chest, back pain and arm numbness, CTA of her chest was performed to rule out dissection.  There is no evidence of dissection.  Her EKG does not show ischemic changes.  She had 2 negative troponins.  She has not had pain in the last 2 days.  She does have some ongoing numbness in her right arm.  It seems more circumferential although potentially it is coming from neuropathy from her neck.  She does not have any current pain in her arm.  No weakness or difficulty grasping things.  Initially I was going to do an MRI but she has a stimulator so this was not able to be done.  CT head shows no acute abnormalities.  I discussed this with the APP on-call for neurology working with Dr. CTheda Sers  They discussed and reviewed the chart.  Apparently patient has complained of intermittent right arm numbness associated with radicular symptoms from her neck and is followed by a neurologist for seizures.  At this point they did not feel any further work-up needed to be done in the ED.  I discussed these findings with the  patient and she is very concerned about the chest pain.  I did discuss potentially trying some antiacid medications as the pain seems to be more central in nature.  She is not amenable to this.  She does not feel that the intense pain that she felt could have been attributed to GERD or gastritis.  She does not have any pain in the right upper quadrant which would be more concerning for gallbladder disease.  No obvious gallstones were noted on the CTA  today.  I did discuss with her following up with a cardiologist and will given outpatient referral.  At this point I do not feel that she needs to be admitted to the hospital.  She was also encouraged to have close follow-up with her neurologist.  Return precautions were given.  Final Clinical Impression(s) / ED Diagnoses Final diagnoses:  Nonspecific chest pain  Right arm numbness  Chest pain, unspecified type    Rx / DC Orders ED Discharge Orders          Ordered    Ambulatory referral to Cardiology       Comments: If you have not heard from the Cardiology office within the next 72 hours please call [phone].   02/14/22 1416              Malvin Johns, MD 02/14/22 1422

## 2022-02-16 NOTE — Progress Notes (Signed)
Cardiology Office Note:    Date:  02/17/2022   ID:  Carol [name], DOB March 20, 1946, MRN [id number]  PCP:  Inda Coke, [place] Providers Cardiologist:  Lenna Sciara, MD Referring MD: Malvin Johns, MD   Chief Complaint/Reason for Referral: Chest pain; ER follow-up  ASSESSMENT:    1. Precordial pain     PLAN:    In order of problems listed above: 1.  Chest pain:  We will obtain a coronary CTA and echocardiogram to evaluate further.  If the patient has mild obstructive coronary artery disease, they will require a statin (with goal LDL < 70) and aspirin, if they have high-grade disease we will need to consider optimal medical therapy and if symptoms are refractory to medical therapy, then a cardiac catheterization with possible PCI will be pursued to alleviate symptoms.  If they have high risk disease we will proceed directly to cardiac catheterization.  We will keep follow-up open-ended depending on these results.            Dispo:  Return if symptoms worsen or fail to improve.      Medication Adjustments/Labs and Tests Ordered: Current medicines are reviewed at length with the patient today.  Concerns regarding medicines are outlined above.  The following changes have been made:  no change   Labs/tests ordered: Orders Placed This Encounter  Procedures   CT CORONARY MORPH W/CTA COR W/SCORE W/CA W/CM &/OR WO/CM   ECHOCARDIOGRAM COMPLETE    Medication Changes: Meds ordered this encounter  Medications   metoprolol tartrate (LOPRESSOR) 50 MG tablet    Sig: Take 1 tablet (50 mg total) by mouth once for 1 dose. Take 90-120 minutes prior to scan.    Dispense:  1 tablet    Refill:  0     Current medicines are reviewed at length with the patient today.  The patient does not have concerns regarding medicines.   History of Present Illness:    FOCUSED PROBLEM LIST:   1.  Asthma 2.  Seizure disorder  The patient is a 76 y.o. female with the  indicated medical history here for emergency room follow-up for chest pain.  Patient was seen in the emergency department few days ago.  She complained of chest pain over the last several weeks.  Last episode was a few days ago.  She described intense pain in the center of her chest.  It is not related to eating or exertion.  It was not associate with shortness of breath.  The evaluation including chest imaging, cardiac biomarkers, and other laboratories was reassuring.  She was discharged home.  The patient tells me that over the last few months she has had 4 episodes of chest discomfort.  Most the time it happens at rest but she has noticed it with exertion.  She will develop a central chest pressure without tenderness to palpation.  It seems to go away on its own.  It was not associate with shortness of breath or palpitations.  She denies any paroxysmal nocturnal dyspnea, orthopnea, or any severe bleeding or bruising.  She has not had a seizure recently.  She does not smoke or drink.  She is moderately active.  She walks and exercises on a routine basis.  She has noticed the pain at times with activity.      Current Medications: Current Meds  Medication Sig   GABAPENTIN PO Take 300 mg by mouth 2 (two) times daily. One am and one pm  metoprolol tartrate (LOPRESSOR) 50 MG tablet Take 1 tablet (50 mg total) by mouth once for 1 dose. Take 90-120 minutes prior to scan.   Oxcarbazepine (TRILEPTAL) 300 MG tablet Take 1 tablet twice a day   traMADol (ULTRAM) 50 MG tablet Take 1 tablet (50 mg total) by mouth every 6 (six) hours as needed.     Allergies:    Fluoride, Other, and Prunus persica   Social History:   Social History   Tobacco Use   Smoking status: Never   Smokeless tobacco: Never  Vaping Use   Vaping Use: Never used  Substance Use Topics   Alcohol use: No   Drug use: Never     Family Hx: Family History  Problem Relation Age of Onset   COPD Mother    COPD Father       Review of Systems:   Please see the history of present illness.    All other systems reviewed and are negative.     EKGs/Labs/Other Test Reviewed:    EKG:  EKG performed May 2023 that I personally reviewed demonstrates sinus rhythm  Prior CV studies: None available  Other studies Reviewed: Review of the additional studies/records demonstrates: CT angiogram chest and aorta 2023 without aortic pathology  Recent Labs: 05/10/2021: ALT 13 02/14/2022: BUN 16; Creatinine, Ser 0.82; Hemoglobin 13.6; Platelets 227; Potassium 4.2; Sodium 139   Recent Lipid Panel Lab Results  Component Value Date/Time   CHOL 182 05/10/2021 09:25 AM   TRIG 92.0 05/10/2021 09:25 AM   HDL 69.00 05/10/2021 09:25 AM   LDLCALC 95 05/10/2021 09:25 AM    Risk Assessment/Calculations:           Physical Exam:    VS:  BP 122/72   Pulse 67   Ht '5\' 1"'$  (1.549 m)   Wt 121 lb (54.9 kg)   SpO2 98%   BMI 22.86 kg/m    Wt Readings from Last 3 Encounters:  02/17/22 121 lb (54.9 kg)  02/14/22 120 lb (54.4 kg)  05/28/21 130 lb 6.4 oz (59.1 kg)    GENERAL:  No apparent distress, AOx3 HEENT:  No carotid bruits, +2 carotid impulses, no scleral icterus CAR: RRR no murmurs, gallops, rubs, or thrills RES:  Clear to auscultation bilaterally ABD:  Soft, nontender, nondistended, positive bowel sounds x 4 VASC:  +2 radial pulses, +2 carotid pulses, palpable pedal pulses NEURO:  CN 2-12 grossly intact; motor and sensory grossly intact PSYCH:  No active depression or anxiety EXT:  No edema, ecchymosis, or cyanosis  Signed, Early Osmond, MD  02/17/2022 10:09 AM    [address], [place], [place]  [address] Phone: [phone]; Fax: [phone]   Note:  This document was prepared using Dragon voice recognition software and may include unintentional dictation errors.

## 2022-02-17 ENCOUNTER — Encounter: Payer: Self-pay | Admitting: Internal Medicine

## 2022-02-17 ENCOUNTER — Ambulatory Visit: Payer: Medicare HMO | Admitting: Internal Medicine

## 2022-02-17 VITALS — BP 122/72 | HR 67 | Ht 61.0 in | Wt 121.0 lb

## 2022-02-17 DIAGNOSIS — R072 Precordial pain: Secondary | ICD-10-CM

## 2022-02-17 MED ORDER — METOPROLOL TARTRATE 50 MG PO TABS: 50.0000 mg | ORAL_TABLET | Freq: Once | ORAL | 0 refills | Status: DC

## 2022-02-17 NOTE — Patient Instructions (Signed)
Medication Instructions:  No changes *If you need a refill on your cardiac medications before your next appointment, please call your pharmacy*   Lab Work: none If you have labs (blood work) drawn today and your tests are completely normal, you will receive your results only by: [place] (if you have MyChart) OR A paper copy in the mail If you have any lab test that is abnormal or we need to change your treatment, we will call you to review the results.   Testing/Procedures: Your physician has requested that you have an echocardiogram. Echocardiography is a painless test that uses sound waves to create images of your heart. It provides your doctor with information about the size and shape of your heart and how well your heart's chambers and valves are working. This procedure takes approximately one hour. There are no restrictions for this procedure.  Cardiac CTA - see instructions below.   Follow-Up: As needed    Your cardiac CT will be scheduled at  [facility] [place], [place] [address] [phone]  Please arrive at the [facility] (Entrance C2) of [facility] 30 minutes prior to test start time. You can use the FREE valet parking offered at entrance C (encouraged to control the heart rate for the test)  Proceed to the [facility] Radiology Department (first floor) to check-in and test prep.  All radiology patients and guests should use entrance C2 at [facility], accessed from [facility], even though the hospital's physical address listed is [address]     Please follow these instructions carefully (unless otherwise directed):   On the Night Before the Test: Be sure to Drink plenty of water. Do not consume any caffeinated/decaffeinated beverages or chocolate 12 hours prior to your test. Do not take any antihistamines 12 hours prior to your test.  On the Day of the  Test: Drink plenty of water until 1 hour prior to the test. Do not eat any food 4 hours prior to the test. You may take your regular medications prior to the test.  Take metoprolol (Lopressor) two hours prior to test. FEMALES- please wear underwire-free bra if available, avoid dresses & tight clothing       After the Test: Drink plenty of water. After receiving IV contrast, you may experience a mild flushed feeling. This is normal. On occasion, you may experience a mild rash up to 24 hours after the test. This is not dangerous. If this occurs, you can take Benadryl 25 mg and increase your fluid intake. If you experience trouble breathing, this can be serious. If it is severe call 911 IMMEDIATELY. If it is mild, please call our office. If you take any of these medications: Glipizide/Metformin, Avandament, Glucavance, please do not take 48 hours after completing test unless otherwise instructed.  We will call to schedule your test 2-4 weeks out understanding that some insurance companies will need an authorization prior to the service being performed.   For non-scheduling related questions, please contact the cardiac imaging nurse navigator should you have any questions/concerns: Marchia Bond, Cardiac Imaging Nurse Navigator Gordy Clement, Cardiac Imaging Nurse Navigator [facility] Direct Office Dial: [phone]   For scheduling needs, including cancellations and rescheduling, please call [place], [phone].   Important Information About Sugar

## 2022-03-09 ENCOUNTER — Telehealth (HOSPITAL_COMMUNITY): Payer: Self-pay | Admitting: Emergency Medicine

## 2022-03-09 ENCOUNTER — Ambulatory Visit (HOSPITAL_COMMUNITY): Payer: Medicare HMO | Attending: Internal Medicine

## 2022-03-09 DIAGNOSIS — R072 Precordial pain: Secondary | ICD-10-CM | POA: Insufficient documentation

## 2022-03-09 LAB — ECHOCARDIOGRAM COMPLETE
AR max vel: 2.61 cm2
AV Area VTI: 2.5 cm2
AV Area mean vel: 2.53 cm2
AV Mean grad: 3 mmHg
AV Peak grad: 5.5 mmHg
Ao pk vel: 1.17 m/s
Area-P 1/2: 3.29 cm2
S' Lateral: 2.1 cm

## 2022-03-09 NOTE — Telephone Encounter (Signed)
Reaching out to patient to offer assistance regarding upcoming cardiac imaging study; pt verbalizes understanding of appt date/time, parking situation and where to check in, pre-test NPO status and medications ordered, and verified current allergies; name and call back number provided for further questions should they arise Marchia Bond RN Navigator Cardiac Imaging [facility] [phone] office [phone] cell  '50mg'$  metoprolol tartrate Arrival 900

## 2022-03-11 ENCOUNTER — Ambulatory Visit (HOSPITAL_COMMUNITY)
Admission: RE | Admit: 2022-03-11 | Discharge: 2022-03-11 | Disposition: A | Discharge status: Home / Self Care | Payer: Medicare HMO | Source: Ambulatory Visit | Attending: Internal Medicine | Admitting: Internal Medicine

## 2022-03-11 DIAGNOSIS — R072 Precordial pain: Secondary | ICD-10-CM | POA: Insufficient documentation

## 2022-03-11 MED ORDER — IOHEXOL 350 MG/ML SOLN
100.0000 mL | Freq: Once | INTRAVENOUS | Status: AC | PRN
  Administered 2022-03-11: 100 mL via INTRAVENOUS

## 2022-03-11 MED ORDER — NITROGLYCERIN 0.4 MG SL SUBL
SUBLINGUAL_TABLET | SUBLINGUAL | Status: AC
  Filled 2022-03-11: qty 2

## 2022-03-11 MED ORDER — NITROGLYCERIN 0.4 MG SL SUBL
0.8000 mg | SUBLINGUAL_TABLET | Freq: Once | SUBLINGUAL | Status: AC
  Administered 2022-03-11: 0.8 mg via SUBLINGUAL

## 2022-03-28 DIAGNOSIS — M5481 Occipital neuralgia: Secondary | ICD-10-CM | POA: Diagnosis not present

## 2022-03-28 DIAGNOSIS — R519 Headache, unspecified: Secondary | ICD-10-CM | POA: Diagnosis not present

## 2022-03-28 DIAGNOSIS — G894 Chronic pain syndrome: Secondary | ICD-10-CM | POA: Diagnosis not present

## 2022-03-28 DIAGNOSIS — M961 Postlaminectomy syndrome, not elsewhere classified: Secondary | ICD-10-CM | POA: Diagnosis not present

## 2022-04-01 ENCOUNTER — Encounter (HOSPITAL_COMMUNITY): Payer: Self-pay | Admitting: Nurse Practitioner

## 2022-04-08 ENCOUNTER — Encounter (HOSPITAL_COMMUNITY): Payer: Self-pay | Admitting: Nurse Practitioner

## 2022-04-19 ENCOUNTER — Encounter (HOSPITAL_COMMUNITY): Payer: Self-pay | Admitting: Nurse Practitioner

## 2022-05-11 ENCOUNTER — Encounter: Payer: Medicare HMO | Admitting: Physician Assistant

## 2022-05-25 DIAGNOSIS — M9902 Segmental and somatic dysfunction of thoracic region: Secondary | ICD-10-CM | POA: Diagnosis not present

## 2022-05-25 DIAGNOSIS — M9908 Segmental and somatic dysfunction of rib cage: Secondary | ICD-10-CM | POA: Diagnosis not present

## 2022-05-25 DIAGNOSIS — M9907 Segmental and somatic dysfunction of upper extremity: Secondary | ICD-10-CM | POA: Diagnosis not present

## 2022-05-25 DIAGNOSIS — M9901 Segmental and somatic dysfunction of cervical region: Secondary | ICD-10-CM | POA: Diagnosis not present

## 2022-05-25 DIAGNOSIS — R519 Headache, unspecified: Secondary | ICD-10-CM | POA: Diagnosis not present

## 2022-05-25 DIAGNOSIS — G40909 Epilepsy, unspecified, not intractable, without status epilepticus: Secondary | ICD-10-CM | POA: Diagnosis not present

## 2022-05-25 DIAGNOSIS — M9900 Segmental and somatic dysfunction of head region: Secondary | ICD-10-CM | POA: Diagnosis not present

## 2022-06-01 ENCOUNTER — Ambulatory Visit: Payer: Medicare HMO | Admitting: Neurology

## 2022-06-01 ENCOUNTER — Encounter: Payer: Self-pay | Admitting: Neurology

## 2022-06-01 VITALS — BP 132/77 | HR 73 | Ht 61.0 in | Wt 121.0 lb

## 2022-06-01 DIAGNOSIS — H5213 Myopia, bilateral: Secondary | ICD-10-CM | POA: Diagnosis not present

## 2022-06-01 DIAGNOSIS — G40009 Localization-related (focal) (partial) idiopathic epilepsy and epileptic syndromes with seizures of localized onset, not intractable, without status epilepticus: Secondary | ICD-10-CM

## 2022-06-01 MED ORDER — OXCARBAZEPINE 300 MG PO TABS: ORAL_TABLET | ORAL | 3 refills | Status: DC
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
## 2022-06-29 DIAGNOSIS — M961 Postlaminectomy syndrome, not elsewhere classified: Secondary | ICD-10-CM | POA: Diagnosis not present

## 2022-06-29 DIAGNOSIS — M5481 Occipital neuralgia: Secondary | ICD-10-CM | POA: Diagnosis not present

## 2022-06-29 DIAGNOSIS — G894 Chronic pain syndrome: Secondary | ICD-10-CM | POA: Diagnosis not present

## 2022-06-29 DIAGNOSIS — R519 Headache, unspecified: Secondary | ICD-10-CM | POA: Diagnosis not present

## 2022-07-20 DIAGNOSIS — R92333 Mammographic heterogeneous density, bilateral breasts: Secondary | ICD-10-CM | POA: Diagnosis not present

## 2022-07-20 DIAGNOSIS — Z1231 Encounter for screening mammogram for malignant neoplasm of breast: Secondary | ICD-10-CM | POA: Diagnosis not present

## 2022-09-27 DIAGNOSIS — M961 Postlaminectomy syndrome, not elsewhere classified: Secondary | ICD-10-CM | POA: Diagnosis not present

## 2022-09-27 DIAGNOSIS — M5481 Occipital neuralgia: Secondary | ICD-10-CM | POA: Diagnosis not present

## 2022-09-27 DIAGNOSIS — R519 Headache, unspecified: Secondary | ICD-10-CM | POA: Diagnosis not present

## 2022-09-27 DIAGNOSIS — G894 Chronic pain syndrome: Secondary | ICD-10-CM | POA: Diagnosis not present

## 2022-09-29 DIAGNOSIS — G40909 Epilepsy, unspecified, not intractable, without status epilepticus: Secondary | ICD-10-CM | POA: Diagnosis not present

## 2022-09-29 DIAGNOSIS — M810 Age-related osteoporosis without current pathological fracture: Secondary | ICD-10-CM | POA: Diagnosis not present

## 2022-09-29 DIAGNOSIS — M9900 Segmental and somatic dysfunction of head region: Secondary | ICD-10-CM | POA: Diagnosis not present

## 2022-09-29 DIAGNOSIS — M9907 Segmental and somatic dysfunction of upper extremity: Secondary | ICD-10-CM | POA: Diagnosis not present

## 2022-09-29 DIAGNOSIS — M9901 Segmental and somatic dysfunction of cervical region: Secondary | ICD-10-CM | POA: Diagnosis not present

## 2022-09-29 DIAGNOSIS — R519 Headache, unspecified: Secondary | ICD-10-CM | POA: Diagnosis not present

## 2022-09-29 DIAGNOSIS — M9902 Segmental and somatic dysfunction of thoracic region: Secondary | ICD-10-CM | POA: Diagnosis not present

## 2022-09-29 DIAGNOSIS — M9908 Segmental and somatic dysfunction of rib cage: Secondary | ICD-10-CM | POA: Diagnosis not present

## 2022-12-21 DIAGNOSIS — M9901 Segmental and somatic dysfunction of cervical region: Secondary | ICD-10-CM | POA: Diagnosis not present

## 2022-12-21 DIAGNOSIS — M961 Postlaminectomy syndrome, not elsewhere classified: Secondary | ICD-10-CM | POA: Diagnosis not present

## 2022-12-21 DIAGNOSIS — G894 Chronic pain syndrome: Secondary | ICD-10-CM | POA: Diagnosis not present

## 2022-12-21 DIAGNOSIS — M5481 Occipital neuralgia: Secondary | ICD-10-CM | POA: Diagnosis not present

## 2022-12-29 DIAGNOSIS — L720 Epidermal cyst: Secondary | ICD-10-CM | POA: Diagnosis not present

## 2023-02-09 DIAGNOSIS — M5481 Occipital neuralgia: Secondary | ICD-10-CM | POA: Diagnosis not present

## 2023-02-09 DIAGNOSIS — Z0000 Encounter for general adult medical examination without abnormal findings: Secondary | ICD-10-CM | POA: Diagnosis not present

## 2023-02-09 DIAGNOSIS — M9901 Segmental and somatic dysfunction of cervical region: Secondary | ICD-10-CM | POA: Diagnosis not present

## 2023-02-09 DIAGNOSIS — G894 Chronic pain syndrome: Secondary | ICD-10-CM | POA: Diagnosis not present

## 2023-02-09 DIAGNOSIS — R7989 Other specified abnormal findings of blood chemistry: Secondary | ICD-10-CM | POA: Diagnosis not present

## 2023-02-09 DIAGNOSIS — M961 Postlaminectomy syndrome, not elsewhere classified: Secondary | ICD-10-CM | POA: Diagnosis not present

## 2023-02-09 DIAGNOSIS — M810 Age-related osteoporosis without current pathological fracture: Secondary | ICD-10-CM | POA: Diagnosis not present

## 2023-02-16 DIAGNOSIS — E559 Vitamin D deficiency, unspecified: Secondary | ICD-10-CM | POA: Diagnosis not present

## 2023-02-16 DIAGNOSIS — Z1331 Encounter for screening for depression: Secondary | ICD-10-CM | POA: Diagnosis not present

## 2023-02-16 DIAGNOSIS — Z1339 Encounter for screening examination for other mental health and behavioral disorders: Secondary | ICD-10-CM | POA: Diagnosis not present

## 2023-02-16 DIAGNOSIS — G40909 Epilepsy, unspecified, not intractable, without status epilepticus: Secondary | ICD-10-CM | POA: Diagnosis not present

## 2023-02-16 DIAGNOSIS — R519 Headache, unspecified: Secondary | ICD-10-CM | POA: Diagnosis not present

## 2023-02-16 DIAGNOSIS — M810 Age-related osteoporosis without current pathological fracture: Secondary | ICD-10-CM | POA: Diagnosis not present

## 2023-02-16 DIAGNOSIS — Z0000 Encounter for general adult medical examination without abnormal findings: Secondary | ICD-10-CM | POA: Diagnosis not present

## 2023-02-16 DIAGNOSIS — R152 Fecal urgency: Secondary | ICD-10-CM | POA: Diagnosis not present

## 2023-02-16 DIAGNOSIS — E46 Unspecified protein-calorie malnutrition: Secondary | ICD-10-CM | POA: Diagnosis not present

## 2023-02-17 ENCOUNTER — Other Ambulatory Visit: Payer: Self-pay | Admitting: Internal Medicine

## 2023-02-17 DIAGNOSIS — R928 Other abnormal and inconclusive findings on diagnostic imaging of breast: Secondary | ICD-10-CM

## 2023-02-17 DIAGNOSIS — N6489 Other specified disorders of breast: Secondary | ICD-10-CM

## 2023-03-06 DIAGNOSIS — L738 Other specified follicular disorders: Secondary | ICD-10-CM | POA: Diagnosis not present

## 2023-03-06 DIAGNOSIS — B078 Other viral warts: Secondary | ICD-10-CM | POA: Diagnosis not present

## 2023-03-06 DIAGNOSIS — L821 Other seborrheic keratosis: Secondary | ICD-10-CM | POA: Diagnosis not present

## 2023-03-06 DIAGNOSIS — L239 Allergic contact dermatitis, unspecified cause: Secondary | ICD-10-CM | POA: Diagnosis not present

## 2023-03-06 DIAGNOSIS — D225 Melanocytic nevi of trunk: Secondary | ICD-10-CM | POA: Diagnosis not present

## 2023-03-06 DIAGNOSIS — D1801 Hemangioma of skin and subcutaneous tissue: Secondary | ICD-10-CM | POA: Diagnosis not present

## 2023-03-06 DIAGNOSIS — D2261 Melanocytic nevi of right upper limb, including shoulder: Secondary | ICD-10-CM | POA: Diagnosis not present

## 2023-03-08 ENCOUNTER — Other Ambulatory Visit: Payer: Medicare HMO

## 2023-03-20 ENCOUNTER — Ambulatory Visit
Admission: RE | Admit: 2023-03-20 | Discharge: 2023-03-20 | Disposition: A | Discharge status: Home / Self Care | Payer: Medicare HMO | Source: Ambulatory Visit | Attending: Internal Medicine | Admitting: Internal Medicine

## 2023-03-20 ENCOUNTER — Ambulatory Visit: Payer: Medicare HMO

## 2023-03-20 DIAGNOSIS — N6489 Other specified disorders of breast: Secondary | ICD-10-CM

## 2023-03-20 DIAGNOSIS — R928 Other abnormal and inconclusive findings on diagnostic imaging of breast: Secondary | ICD-10-CM

## 2023-04-11 DIAGNOSIS — M9901 Segmental and somatic dysfunction of cervical region: Secondary | ICD-10-CM | POA: Diagnosis not present

## 2023-04-11 DIAGNOSIS — M5481 Occipital neuralgia: Secondary | ICD-10-CM | POA: Diagnosis not present

## 2023-04-11 DIAGNOSIS — G894 Chronic pain syndrome: Secondary | ICD-10-CM | POA: Diagnosis not present

## 2023-04-11 DIAGNOSIS — M961 Postlaminectomy syndrome, not elsewhere classified: Secondary | ICD-10-CM | POA: Diagnosis not present

## 2023-05-20 ENCOUNTER — Other Ambulatory Visit: Payer: Self-pay | Admitting: Neurology

## 2023-05-24 ENCOUNTER — Ambulatory Visit: Payer: Medicare HMO | Admitting: Neurology

## 2023-05-24 ENCOUNTER — Encounter: Payer: Self-pay | Admitting: Neurology

## 2023-05-24 VITALS — BP 143/70 | HR 78 | Ht 61.0 in | Wt 114.0 lb

## 2023-05-24 DIAGNOSIS — G40009 Localization-related (focal) (partial) idiopathic epilepsy and epileptic syndromes with seizures of localized onset, not intractable, without status epilepticus: Secondary | ICD-10-CM

## 2023-05-24 DIAGNOSIS — R519 Headache, unspecified: Secondary | ICD-10-CM

## 2023-05-24 MED ORDER — OXCARBAZEPINE 300 MG PO TABS: ORAL_TABLET | ORAL | 3 refills | Status: DC
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
## 2023-06-05 DIAGNOSIS — H5213 Myopia, bilateral: Secondary | ICD-10-CM | POA: Diagnosis not present

## 2023-07-10 DIAGNOSIS — M9901 Segmental and somatic dysfunction of cervical region: Secondary | ICD-10-CM | POA: Diagnosis not present

## 2023-07-10 DIAGNOSIS — Z79891 Long term (current) use of opiate analgesic: Secondary | ICD-10-CM | POA: Diagnosis not present

## 2023-07-10 DIAGNOSIS — G894 Chronic pain syndrome: Secondary | ICD-10-CM | POA: Diagnosis not present

## 2023-07-10 DIAGNOSIS — M5481 Occipital neuralgia: Secondary | ICD-10-CM | POA: Diagnosis not present

## 2023-07-10 DIAGNOSIS — M961 Postlaminectomy syndrome, not elsewhere classified: Secondary | ICD-10-CM | POA: Diagnosis not present

## 2023-08-30 ENCOUNTER — Encounter: Payer: Self-pay | Admitting: Internal Medicine

## 2023-08-30 DIAGNOSIS — M9900 Segmental and somatic dysfunction of head region: Secondary | ICD-10-CM | POA: Diagnosis not present

## 2023-08-30 DIAGNOSIS — M810 Age-related osteoporosis without current pathological fracture: Secondary | ICD-10-CM | POA: Diagnosis not present

## 2023-08-30 DIAGNOSIS — M9901 Segmental and somatic dysfunction of cervical region: Secondary | ICD-10-CM | POA: Diagnosis not present

## 2023-08-30 DIAGNOSIS — E46 Unspecified protein-calorie malnutrition: Secondary | ICD-10-CM | POA: Diagnosis not present

## 2023-08-30 DIAGNOSIS — E559 Vitamin D deficiency, unspecified: Secondary | ICD-10-CM | POA: Diagnosis not present

## 2023-08-30 DIAGNOSIS — G40909 Epilepsy, unspecified, not intractable, without status epilepticus: Secondary | ICD-10-CM | POA: Diagnosis not present

## 2023-08-30 DIAGNOSIS — M9907 Segmental and somatic dysfunction of upper extremity: Secondary | ICD-10-CM | POA: Diagnosis not present

## 2023-08-30 DIAGNOSIS — R519 Headache, unspecified: Secondary | ICD-10-CM | POA: Diagnosis not present

## 2023-08-30 DIAGNOSIS — R152 Fecal urgency: Secondary | ICD-10-CM | POA: Diagnosis not present

## 2023-08-31 ENCOUNTER — Other Ambulatory Visit: Payer: Self-pay | Admitting: Internal Medicine

## 2023-08-31 DIAGNOSIS — R519 Headache, unspecified: Secondary | ICD-10-CM

## 2023-09-07 ENCOUNTER — Other Ambulatory Visit: Payer: Self-pay | Admitting: Internal Medicine

## 2023-09-07 ENCOUNTER — Other Ambulatory Visit (HOSPITAL_COMMUNITY): Payer: Self-pay | Admitting: Internal Medicine

## 2023-09-07 DIAGNOSIS — R519 Headache, unspecified: Secondary | ICD-10-CM

## 2023-09-08 ENCOUNTER — Other Ambulatory Visit (HOSPITAL_BASED_OUTPATIENT_CLINIC_OR_DEPARTMENT_OTHER): Payer: Self-pay | Admitting: Internal Medicine

## 2023-09-08 DIAGNOSIS — R519 Headache, unspecified: Secondary | ICD-10-CM

## 2023-09-11 ENCOUNTER — Ambulatory Visit: Payer: Medicare HMO | Admitting: Family Medicine

## 2023-09-11 ENCOUNTER — Ambulatory Visit (HOSPITAL_COMMUNITY)
Admission: RE | Admit: 2023-09-11 | Discharge: 2023-09-11 | Disposition: A | Discharge status: Home / Self Care | Payer: Medicare HMO | Source: Ambulatory Visit | Attending: Internal Medicine | Admitting: Internal Medicine

## 2023-09-11 ENCOUNTER — Encounter: Payer: Self-pay | Admitting: Family Medicine

## 2023-09-11 VITALS — BP 136/90 | HR 100 | Ht 61.0 in | Wt 109.0 lb

## 2023-09-11 DIAGNOSIS — M47812 Spondylosis without myelopathy or radiculopathy, cervical region: Secondary | ICD-10-CM | POA: Diagnosis not present

## 2023-09-11 DIAGNOSIS — R519 Headache, unspecified: Secondary | ICD-10-CM | POA: Insufficient documentation

## 2023-09-11 DIAGNOSIS — R159 Full incontinence of feces: Secondary | ICD-10-CM | POA: Diagnosis not present
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
## 2023-09-11 NOTE — Assessment & Plan Note (Addendum)
Discussed with patient at great length.  We discussed how agree home exercises would be the most beneficial.  Discussed which activities to do and which ones to avoid.  Patient declined formal physical therapy at the moment.  We discussed different medication use including potentially changing to Cymbalta.  With patient having the epileptic seizure disorder and did not know if tramadol is the right medication for her.  We discussed that if necessary we could consider a CT myelogram if needed.  Patient does seem to have some labile emotional changes.  Concern with adding too many medications secondary to her symptoms at the moment as well.    We discussed otherwise following up with [place] again in 2 to 3 months after trying the exercises.  Patient did leave before we can get her the exercises so we will try to send them in MyChart.

## 2023-09-11 NOTE — Assessment & Plan Note (Signed)
Encourage patient to follow-up with her general surgeon to see if there would be a potential possibility of removal of the stimulator that is not working.  If that is possible then we can consider the possibility of an MRI.

## 2023-09-11 NOTE — Patient Instructions (Signed)
Do prescribed exercises at least 3x a week The better medication is Cymbalta See you again in 2-3 months

## 2023-10-03 ENCOUNTER — Other Ambulatory Visit (INDEPENDENT_AMBULATORY_CARE_PROVIDER_SITE_OTHER): Payer: Medicare HMO

## 2023-10-03 ENCOUNTER — Ambulatory Visit: Payer: Medicare HMO | Admitting: Orthopaedic Surgery

## 2023-10-03 ENCOUNTER — Encounter: Payer: Self-pay | Admitting: Orthopaedic Surgery

## 2023-10-03 VITALS — BP 122/79 | Ht 61.0 in | Wt 109.0 lb

## 2023-10-03 DIAGNOSIS — M549 Dorsalgia, unspecified: Secondary | ICD-10-CM

## 2023-10-03 DIAGNOSIS — M961 Postlaminectomy syndrome, not elsewhere classified: Secondary | ICD-10-CM | POA: Diagnosis not present

## 2023-10-03 DIAGNOSIS — G894 Chronic pain syndrome: Secondary | ICD-10-CM | POA: Diagnosis not present

## 2023-10-03 DIAGNOSIS — M5481 Occipital neuralgia: Secondary | ICD-10-CM | POA: Diagnosis not present

## 2023-10-03 DIAGNOSIS — M542 Cervicalgia: Secondary | ICD-10-CM

## 2023-10-03 DIAGNOSIS — M9901 Segmental and somatic dysfunction of cervical region: Secondary | ICD-10-CM | POA: Diagnosis not present

## 2023-10-03 NOTE — Progress Notes (Signed)
 Office Visit Note   Patient: Carol [name]           Date of Birth: 1946/05/26           MRN: [id number] Visit Date: 10/03/2023              Requested by: Valentin Skates, DO [address] [place],  [place] [address] PCP: Valentin Skates, DO   Assessment & Plan: Visit Diagnoses:  1. Neck pain   2. Mid back pain   3. Cervicalgia     Plan: With the patient's sacral stimulator she will need a cervical and thoracic myelogram for evaluation.  She does have some spondylosis adjacent above and below her solid fusion C3-C7.  Follow-Up Instructions: No follow-ups on file.   Orders:  Orders Placed This Encounter  Procedures   XR Cervical Spine 2 or 3 views   XR Thoracic Spine 2 View   DG Myelogram 2+ Regions   CT CERVICAL SPINE W CONTRAST   CT THORACIC SPINE W CONTRAST   DG MYELOGRAPHY LUMBAR INJ MULTI REGION   No orders of the defined types were placed in this encounter.     Procedures: No procedures performed   Clinical Data: No additional findings.   Subjective: Chief Complaint  Patient presents with   Neck - Pain    HPI 78 year old female called states she has been seen by sports medicine with continued problems with neck pain.  MRI scan had been ordered but she had stimulator which is a sacral stimulator due to past history of severe constipation with only 1 bowel movement approximately once a week.  With the stimulator she has had problems with fecal incontinence and has had the stimulator turned off.  She has had EEG which shows some epileptic form type changes during sleep.  She has had chronic neck pains had previous multiple surgeries on her neck with fusions anterior and posterior she has fused C3-C7.  She has some adjacent degenerative changes.  She also had disc protrusions at T7-8 and states she has been taking gabapentin tramadol  120 a month.  Other medications have been discussed including Nurtec, Cymbalta to help with her chronic neck pain.  No imaging  studies done in several years.  States the pain is point where she is unable to handle it posteriorly in the neck she has limitation of rotation and expected no myelopathic symptoms.  Patient's last imaging studies cervical spine MRI showed moderate degenerative changes C1-C2 and disc degeneration T1-2 T2-3 with disc bulge.  Patient states her pain is progressed and is rated at 7 or 8 out of 10.  Review of Systems positive for epileptic type condition, chronic constipation, sacral stimulator currently turned off.   Objective: Vital Signs: BP 122/79   Ht 5' 1 (1.549 m)   Wt 109 lb (49.4 kg)   BMI 20.60 kg/m   Physical Exam Constitutional:      Appearance: She is well-developed.  HENT:     Head: Normocephalic.     Right Ear: External ear normal.     Left Ear: External ear normal. There is no impacted cerumen.  Eyes:     Pupils: Pupils are equal, round, and reactive to light.  Neck:     Thyroid : No thyromegaly.     Trachea: No tracheal deviation.  Cardiovascular:     Rate and Rhythm: Normal rate.  Pulmonary:     Effort: Pulmonary effort is normal.  Abdominal:     Palpations: Abdomen is soft.  Musculoskeletal:     Cervical back: No rigidity.  Skin:    General: Skin is warm and dry.  Neurological:     Mental Status: She is alert and oriented to person, place, and time.  Psychiatric:        Behavior: Behavior normal.     Ortho Exam patient is well-healed anterior and posterior incisions.  Brachioplexus tenderness right and left tenderness over the trapezial muscles paraspinals.  Both incisions are well-healed.  Negative drop arm test reflexes are intact no lower extremity clonus normal heel-toe gait.  Specialty Comments:  No specialty comments available.  Imaging: XR Cervical Spine 2 or 3 views Result Date: 10/04/2023 AP lateral cervical spine images are obtained and reviewed.  This shows anterior posterior instrumentation with solid fusion from C3-C7.  Again noted broken  screw C5.  To posterior wires.  C7-T1 again 2 mm anterolisthesis. Impression: Previous cervical fusion solid with instrumentation anterior posterior C3-C7 with adjacent level degenerative changes worse below the fusion.  XR Thoracic Spine 2 View Result Date: 10/04/2023 AP lateral thoracic spine images are obtained and reviewed.  Spondylitic changes T7-T8 without compression.  Portion of the lung field seen is clear. Impression: Spondylitic spurring T7-T8 stable.    PMFS History: Patient Active Problem List   Diagnosis Date Noted   Localization-related idiopathic epilepsy and epileptic syndromes with seizures of localized onset, not intractable, without status epilepticus (HCC) 04/27/2020   Facet arthropathy, cervical 10/08/2018   S/P cervical spinal fusion 09/24/2018   Incontinence of feces 05/01/2018   Hyperlipidemia 05/01/2018   Internal hemorrhoids 10/11/2017   Neuropathic pain of chest 08/14/2017   Atrophy of vagina 06/26/2017   Mixed stress and urge urinary incontinence 06/26/2017   Osteopenia 06/26/2017   Postmenopausal bleeding 06/26/2017   Neck pain 06/26/2017   Past Medical History:  Diagnosis Date   Cervical spondylosis without myelopathy    Chronic neck pain    Fecal incontinence    History of asthma    per pt has issues with [place] smoke, no issues since age 72's when smoking was ban inside   History of kidney stones    Idiopathic generalized epilepsy [facility]) neurologist---  dr georjean   new onset seizure april and august 2020;  dx 10/ 2020    (11-25-2019 pt stated last episode 01/ 19/ 2021 was noctural)   Mixed stress and urge urinary incontinence    Wears contact lenses     Family History  Problem Relation Age of Onset   COPD Mother    COPD Father    Cancer Brother     Past Surgical History:  Procedure Laterality Date   ANAL RECTAL MANOMETRY N/A 10/09/2019   Procedure: ANAL MANOMETRY;  Surgeon: Debby Hila, MD;  Location: [facility];  Service: Endoscopy;   Laterality: N/A;   DILATION AND CURETTAGE OF UTERUS     ELBOW SURGERY Right    NECK SURGERY     x  6 3 plates in neck   TOE SURGERY Left    x 4    Social History   Occupational History   Occupation: retired  Tobacco Use   Smoking status: Never   Smokeless tobacco: Never  Vaping Use   Vaping status: Never Used  Substance and Sexual Activity   Alcohol  use: No   Drug use: Never   Sexual activity: Not Currently

## 2023-10-10 ENCOUNTER — Encounter: Payer: Self-pay | Admitting: Orthopaedic Surgery

## 2023-10-11 ENCOUNTER — Encounter: Payer: Self-pay | Admitting: Orthopaedic Surgery

## 2023-10-17 NOTE — Discharge Instructions (Signed)

## 2023-10-18 ENCOUNTER — Other Ambulatory Visit: Payer: Medicare HMO

## 2023-10-18 ENCOUNTER — Inpatient Hospital Stay
Admission: RE | Admit: 2023-10-18 | Discharge: 2023-10-18 | Disposition: A | Discharge status: Home / Self Care | Payer: Medicare HMO | Source: Ambulatory Visit | Attending: Orthopaedic Surgery | Admitting: Orthopaedic Surgery

## 2023-10-18 ENCOUNTER — Inpatient Hospital Stay: Admission: RE | Admit: 2023-10-18 | Payer: Medicare HMO | Source: Ambulatory Visit

## 2023-10-30 ENCOUNTER — Telehealth: Payer: Self-pay | Admitting: Radiology

## 2023-10-30 NOTE — Telephone Encounter (Signed)
Patient left voicemail stating CT Myelo's have been denied by her insurance. They are scheduled for Thursday and she needs to know what to do.  Received message from Aurora with [facility] stating she is going to fax denial over so that we may do peer to peer. Awaiting fax.  CB for patient [phone]  I did call patient and advise I am awaiting paperwork on denial and will reach back out to her after I have spoken with Dr. Ophelia Charter.

## 2023-10-31 NOTE — Telephone Encounter (Signed)
Message sent to Dr. Ophelia Charter on referral to see if he would like to schedule peer to peer. Awaiting response.

## 2023-11-01 DIAGNOSIS — M5481 Occipital neuralgia: Secondary | ICD-10-CM | POA: Diagnosis not present

## 2023-11-01 DIAGNOSIS — M9901 Segmental and somatic dysfunction of cervical region: Secondary | ICD-10-CM | POA: Diagnosis not present

## 2023-11-01 DIAGNOSIS — M961 Postlaminectomy syndrome, not elsewhere classified: Secondary | ICD-10-CM | POA: Diagnosis not present

## 2023-11-01 DIAGNOSIS — G894 Chronic pain syndrome: Secondary | ICD-10-CM | POA: Diagnosis not present

## 2023-11-01 NOTE — Telephone Encounter (Signed)
 Per Dr. Murrel Arnt, ok to schedule peer to peer. Message sent on referral to Terri to schedule.

## 2023-11-02 ENCOUNTER — Other Ambulatory Visit: Payer: Medicare HMO

## 2023-11-10 DIAGNOSIS — R519 Headache, unspecified: Secondary | ICD-10-CM | POA: Diagnosis not present

## 2023-11-10 DIAGNOSIS — G40909 Epilepsy, unspecified, not intractable, without status epilepticus: Secondary | ICD-10-CM | POA: Diagnosis not present

## 2023-11-10 DIAGNOSIS — E559 Vitamin D deficiency, unspecified: Secondary | ICD-10-CM | POA: Diagnosis not present

## 2023-11-10 DIAGNOSIS — M810 Age-related osteoporosis without current pathological fracture: Secondary | ICD-10-CM | POA: Diagnosis not present

## 2023-11-10 DIAGNOSIS — K5900 Constipation, unspecified: Secondary | ICD-10-CM | POA: Diagnosis not present

## 2023-11-10 DIAGNOSIS — R152 Fecal urgency: Secondary | ICD-10-CM | POA: Diagnosis not present

## 2023-11-10 DIAGNOSIS — E46 Unspecified protein-calorie malnutrition: Secondary | ICD-10-CM | POA: Diagnosis not present

## 2023-11-24 ENCOUNTER — Other Ambulatory Visit: Payer: Self-pay | Admitting: Radiology

## 2023-11-24 DIAGNOSIS — M542 Cervicalgia: Secondary | ICD-10-CM

## 2023-11-24 DIAGNOSIS — M549 Dorsalgia, unspecified: Secondary | ICD-10-CM

## 2023-12-10 DIAGNOSIS — M9901 Segmental and somatic dysfunction of cervical region: Secondary | ICD-10-CM | POA: Diagnosis not present

## 2023-12-10 DIAGNOSIS — M961 Postlaminectomy syndrome, not elsewhere classified: Secondary | ICD-10-CM | POA: Diagnosis not present

## 2023-12-10 DIAGNOSIS — M47812 Spondylosis without myelopathy or radiculopathy, cervical region: Secondary | ICD-10-CM | POA: Diagnosis not present

## 2023-12-10 DIAGNOSIS — G894 Chronic pain syndrome: Secondary | ICD-10-CM | POA: Diagnosis not present

## 2023-12-18 DIAGNOSIS — M9901 Segmental and somatic dysfunction of cervical region: Secondary | ICD-10-CM | POA: Diagnosis not present

## 2023-12-18 DIAGNOSIS — M5481 Occipital neuralgia: Secondary | ICD-10-CM | POA: Diagnosis not present

## 2023-12-18 DIAGNOSIS — G894 Chronic pain syndrome: Secondary | ICD-10-CM | POA: Diagnosis not present

## 2023-12-18 DIAGNOSIS — M961 Postlaminectomy syndrome, not elsewhere classified: Secondary | ICD-10-CM | POA: Diagnosis not present

## 2023-12-20 DIAGNOSIS — M9901 Segmental and somatic dysfunction of cervical region: Secondary | ICD-10-CM | POA: Diagnosis not present

## 2023-12-20 DIAGNOSIS — M961 Postlaminectomy syndrome, not elsewhere classified: Secondary | ICD-10-CM | POA: Diagnosis not present

## 2023-12-20 DIAGNOSIS — M47812 Spondylosis without myelopathy or radiculopathy, cervical region: Secondary | ICD-10-CM | POA: Diagnosis not present

## 2023-12-20 DIAGNOSIS — G894 Chronic pain syndrome: Secondary | ICD-10-CM | POA: Diagnosis not present

## 2023-12-25 ENCOUNTER — Ambulatory Visit: Payer: Self-pay | Admitting: General Surgery

## 2023-12-25 DIAGNOSIS — R159 Full incontinence of feces: Secondary | ICD-10-CM | POA: Diagnosis not present

## 2023-12-25 NOTE — H&P (Signed)
 REFERRING PHYSICIAN:  Charlane Ferretti  PROVIDER:  Elenora Gamma, MD  MRN: [id number] DOB: [birth year] DATE OF ENCOUNTER: 12/25/2023  Subjective   Chief Complaint: New Consultation (Removal of Bowel Stimulator)     History of Present Illness: Carol [name] is a 78 y.o. female who is seen today as an office consultation at the request of Dr. Thornell Mule for evaluation of New Consultation (Removal of Bowel Stimulator) .   78 y.o. F with urine and fecal incontinence who underwent SNS placement March 2021.  She has since developed a lot of neck pain and is needing to get an MRI.  Pt states that she currently doesn't use the device and it is switched off.  Currently fecal incontinence is treated by constipation.    Review of Systems: A complete review of systems was obtained from the patient.  I have reviewed this information and discussed as appropriate with the patient.  See HPI as well for other ROS.    Medical History: Past Medical History:  Diagnosis Date   Seizures (CMS/HHS-HCC)     There is no problem list on file for this patient.   History reviewed. No pertinent surgical history.   No Known Allergies  Current Outpatient Medications on File Prior to Visit  Medication Sig Dispense Refill   butalbital-acetaminophen-caffeine (FIORICET) 50-325-40 mg tablet Take 1 tablet by mouth every 6 (six) hours as needed     erythromycin (ROMYCIN) ophthalmic ointment      gabapentin (NEURONTIN) 300 MG capsule 1 CAPSULE BY MOUTH EVERY EIGHT HOURS     NURTEC ODT 75 mg disintegrating tablet PLACE/DISSOLVE 1 TABLET ON TONGUE AS NEEDED. DO NO NOT TAKE MORE THAN 1 TABLET/24 HOURS 30     OXcarbazepine (TRILEPTAL) 300 MG tablet Take 1 tablet by mouth 2 (two) times daily     traMADoL (ULTRAM) 50 mg tablet Take 1 tablet by mouth every 6 (six) hours as needed     zonisamide (ZONEGRAN) 100 MG capsule      No current facility-administered medications on file prior to visit.    No family  history on file.   Social History   Tobacco Use  Smoking Status Not on file  Smokeless Tobacco Not on file     Social History   Socioeconomic History   Marital status: Married   Social Drivers of Health   Financial Resource Strain: Low Risk  (12/07/2021)   Received from [facility]   Overall Financial Resource Strain (CARDIA)    Difficulty of Paying Living Expenses: Not hard at all  Food Insecurity: No Food Insecurity (12/07/2021)   Received from [facility]   Hunger Vital Sign    Worried About Running Out of Food in the Last Year: Never true    Ran Out of Food in the Last Year: Never true  Transportation Needs: No Transportation Needs (12/07/2021)   Received from [facility] - Transportation    Lack of Transportation (Medical): No    Lack of Transportation (Non-Medical): No  Physical Activity: Sufficiently Active (12/07/2021)   Received from [facility]   Exercise Vital Sign    Days of Exercise per Week: 7 days    Minutes of Exercise per Session: 120 min  Stress: No Stress Concern Present (12/07/2021)   Received from [facility] - Occupational Stress Questionnaire    Feeling of Stress : Not at all  Social Connections: Unknown (02/04/2022)   Received from [facility]  Social Network  Recent Concern: Social Connections - Moderately Isolated (12/07/2021)   Received from [facility]   Social Connection and Isolation Panel [NHANES]    Frequency of Communication with Friends and Family: More than three times a week    Frequency of Social Gatherings with Friends and Family: More than three times a week    Attends Religious Services: Never    Database administrator or Organizations: No    Attends Banker Meetings: Never    Marital Status: Married  Housing Stability: Unknown (12/25/2023)   Housing Stability Vital Sign    Homeless in the Last Year: No    Objective:    Vitals:   12/25/23 0920  BP: (!)  198/83  Pulse: 95  Temp: 37.1 C (98.7 F)  SpO2: 96%  Weight: 53.1 kg (117 lb)  Height: 154.9 cm (5\' 1" )  PainSc: 0-No pain     Exam Gen: NAD Abd: soft     Labs, Imaging and Diagnostic Testing: Previous op note, anal manometry notes reviewed  Assessment and Plan:  Diagnoses and all orders for this visit:  Incontinence of feces, unspecified fecal incontinence type     Pt with SNS that is not being used and she would like to get an MRI.  Will remove device.  Risks include bleeding, pain and infection.  All questions answered.    Vanita Panda, MD Colon and Rectal Surgery [facility]

## 2024-01-18 DIAGNOSIS — M9901 Segmental and somatic dysfunction of cervical region: Secondary | ICD-10-CM | POA: Diagnosis not present

## 2024-01-18 DIAGNOSIS — G894 Chronic pain syndrome: Secondary | ICD-10-CM | POA: Diagnosis not present

## 2024-01-18 DIAGNOSIS — M47812 Spondylosis without myelopathy or radiculopathy, cervical region: Secondary | ICD-10-CM | POA: Diagnosis not present

## 2024-01-18 DIAGNOSIS — M961 Postlaminectomy syndrome, not elsewhere classified: Secondary | ICD-10-CM | POA: Diagnosis not present

## 2024-01-29 DIAGNOSIS — M961 Postlaminectomy syndrome, not elsewhere classified: Secondary | ICD-10-CM | POA: Diagnosis not present

## 2024-01-29 DIAGNOSIS — M9901 Segmental and somatic dysfunction of cervical region: Secondary | ICD-10-CM | POA: Diagnosis not present

## 2024-01-29 DIAGNOSIS — M5481 Occipital neuralgia: Secondary | ICD-10-CM | POA: Diagnosis not present

## 2024-01-29 DIAGNOSIS — G894 Chronic pain syndrome: Secondary | ICD-10-CM | POA: Diagnosis not present

## 2024-02-17 DIAGNOSIS — M47812 Spondylosis without myelopathy or radiculopathy, cervical region: Secondary | ICD-10-CM | POA: Diagnosis not present

## 2024-02-17 DIAGNOSIS — G894 Chronic pain syndrome: Secondary | ICD-10-CM | POA: Diagnosis not present

## 2024-02-17 DIAGNOSIS — M961 Postlaminectomy syndrome, not elsewhere classified: Secondary | ICD-10-CM | POA: Diagnosis not present

## 2024-02-17 DIAGNOSIS — M9901 Segmental and somatic dysfunction of cervical region: Secondary | ICD-10-CM | POA: Diagnosis not present

## 2024-02-21 DIAGNOSIS — D509 Iron deficiency anemia, unspecified: Secondary | ICD-10-CM | POA: Diagnosis not present

## 2024-02-21 DIAGNOSIS — E559 Vitamin D deficiency, unspecified: Secondary | ICD-10-CM | POA: Diagnosis not present

## 2024-02-21 DIAGNOSIS — M810 Age-related osteoporosis without current pathological fracture: Secondary | ICD-10-CM | POA: Diagnosis not present

## 2024-02-21 DIAGNOSIS — E46 Unspecified protein-calorie malnutrition: Secondary | ICD-10-CM | POA: Diagnosis not present

## 2024-02-21 DIAGNOSIS — I10 Essential (primary) hypertension: Secondary | ICD-10-CM | POA: Diagnosis not present

## 2024-02-22 DIAGNOSIS — D649 Anemia, unspecified: Secondary | ICD-10-CM | POA: Diagnosis not present

## 2024-02-28 DIAGNOSIS — D509 Iron deficiency anemia, unspecified: Secondary | ICD-10-CM | POA: Diagnosis not present

## 2024-02-28 DIAGNOSIS — K5900 Constipation, unspecified: Secondary | ICD-10-CM | POA: Diagnosis not present

## 2024-02-28 DIAGNOSIS — E559 Vitamin D deficiency, unspecified: Secondary | ICD-10-CM | POA: Diagnosis not present

## 2024-02-28 DIAGNOSIS — R519 Headache, unspecified: Secondary | ICD-10-CM | POA: Diagnosis not present

## 2024-02-28 DIAGNOSIS — R152 Fecal urgency: Secondary | ICD-10-CM | POA: Diagnosis not present

## 2024-02-28 DIAGNOSIS — Z7184 Encounter for health counseling related to travel: Secondary | ICD-10-CM | POA: Diagnosis not present

## 2024-02-28 DIAGNOSIS — Z1339 Encounter for screening examination for other mental health and behavioral disorders: Secondary | ICD-10-CM | POA: Diagnosis not present

## 2024-02-28 DIAGNOSIS — G40909 Epilepsy, unspecified, not intractable, without status epilepticus: Secondary | ICD-10-CM | POA: Diagnosis not present

## 2024-02-28 DIAGNOSIS — M810 Age-related osteoporosis without current pathological fracture: Secondary | ICD-10-CM | POA: Diagnosis not present

## 2024-02-28 DIAGNOSIS — Z1331 Encounter for screening for depression: Secondary | ICD-10-CM | POA: Diagnosis not present

## 2024-02-28 DIAGNOSIS — Z0000 Encounter for general adult medical examination without abnormal findings: Secondary | ICD-10-CM | POA: Diagnosis not present

## 2024-03-01 NOTE — Progress Notes (Signed)
 Dr. Andy Bannister' office aware of need for neuro clearance for upcoming surgery.

## 2024-03-04 ENCOUNTER — Other Ambulatory Visit: Payer: Self-pay

## 2024-03-04 ENCOUNTER — Encounter (HOSPITAL_BASED_OUTPATIENT_CLINIC_OR_DEPARTMENT_OTHER): Payer: Self-pay | Admitting: General Surgery

## 2024-03-04 DIAGNOSIS — M9901 Segmental and somatic dysfunction of cervical region: Secondary | ICD-10-CM | POA: Diagnosis not present

## 2024-03-04 DIAGNOSIS — M961 Postlaminectomy syndrome, not elsewhere classified: Secondary | ICD-10-CM | POA: Diagnosis not present

## 2024-03-04 DIAGNOSIS — G894 Chronic pain syndrome: Secondary | ICD-10-CM | POA: Diagnosis not present

## 2024-03-04 DIAGNOSIS — M5481 Occipital neuralgia: Secondary | ICD-10-CM | POA: Diagnosis not present

## 2024-03-04 NOTE — Progress Notes (Signed)
   03/04/24 1114  PAT Phone Screen  Is the patient taking a GLP-1 receptor agonist? No  Do You Have Diabetes? No  Do You Have Hypertension? No  Have You Ever Been to the ER for Asthma? No  Have You Taken Oral Steroids in the Past 3 Months? No  Do you Take Phenteramine or any Other Diet Drugs? No  Recent  Lab Work, EKG, CXR? No  Do you have a history of heart problems? No  Any Recent Hospitalizations? No  Height 5\' 1"  (1.549 m)  Weight 49.4 kg  Pat Appointment Scheduled No   Note from last office visit with Dr Ty Gales reviewed with Dr Cleora Daft. Pt is taking medicine as prescribed and denies any seizure activity. No additional clearance needed prior to surgery. Tammy at Dr Linn Rich office notified.

## 2024-03-07 NOTE — Anesthesia Preprocedure Evaluation (Addendum)
 Anesthesia Evaluation  Patient identified by MRN, date of birth, ID band Patient awake    Reviewed: Allergy & Precautions, NPO status , Patient's Chart, lab work & pertinent test results  History of Anesthesia Complications Negative for: history of anesthetic complications  Airway Mallampati: II  TM Distance: >3 FB Neck ROM: Full    Dental  (+) Dental Advisory Given   Pulmonary neg pulmonary ROS   breath sounds clear to auscultation       Cardiovascular hypertension (borderline, no meds),  Rhythm:Regular Rate:Normal  '23 ECHO: EF 70 to 75%.  1. LV EF 71 %. The left ventricle has hyperdynamic function, no regional wall motion  abnormalities. Grade I diastolic dysfunction (impaired relaxation).   2. RVF is normal. The right ventricular size is normal. There is normal pulmonary artery systolic pressure. The estimated right ventricular systolic pressure is 33.4 mmHg.   3. The mitral valve is abnormal. Mild mitral valve regurgitation.   4. The aortic valve is tricuspid. Aortic valve regurgitation is not visualized.     Neuro/Psych Seizures -, Well Controlled,  Chronic pain    GI/Hepatic Neg liver ROS,,,Fecal incontinence   Endo/Other  negative endocrine ROS    Renal/GU negative Renal ROS     Musculoskeletal   Abdominal   Peds  Hematology negative hematology ROS (+)   Anesthesia Other Findings   Reproductive/Obstetrics                             Anesthesia Physical Anesthesia Plan  ASA: 3  Anesthesia Plan: MAC   Post-op Pain Management: Tylenol  PO (pre-op)*   Induction:   PONV Risk Score and Plan: 2 and Treatment may vary due to age or medical condition  Airway Management Planned: Natural Airway and Simple Face Mask  Additional Equipment: None  Intra-op Plan:   Post-operative Plan:   Informed Consent: I have reviewed the patients History and Physical, chart, labs and discussed  the procedure including the risks, benefits and alternatives for the proposed anesthesia with the patient or authorized representative who has indicated his/her understanding and acceptance.     Dental advisory given  Plan Discussed with: CRNA and Surgeon  Anesthesia Plan Comments:         Anesthesia Quick Evaluation

## 2024-03-08 ENCOUNTER — Encounter (HOSPITAL_BASED_OUTPATIENT_CLINIC_OR_DEPARTMENT_OTHER)
Admission: RE | Disposition: A | Discharge status: Home / Self Care | Payer: Self-pay | Source: Home / Self Care | Attending: General Surgery

## 2024-03-08 ENCOUNTER — Other Ambulatory Visit: Payer: Self-pay

## 2024-03-08 ENCOUNTER — Ambulatory Visit (HOSPITAL_BASED_OUTPATIENT_CLINIC_OR_DEPARTMENT_OTHER): Payer: Self-pay | Admitting: Anesthesiology

## 2024-03-08 ENCOUNTER — Encounter (HOSPITAL_BASED_OUTPATIENT_CLINIC_OR_DEPARTMENT_OTHER): Payer: Self-pay | Admitting: General Surgery

## 2024-03-08 ENCOUNTER — Ambulatory Visit (HOSPITAL_BASED_OUTPATIENT_CLINIC_OR_DEPARTMENT_OTHER)
Admission: RE | Admit: 2024-03-08 | Discharge: 2024-03-08 | Disposition: A | Discharge status: Home / Self Care | Attending: General Surgery | Admitting: General Surgery

## 2024-03-08 DIAGNOSIS — Z4542 Encounter for adjustment and management of neuropacemaker (brain) (peripheral nerve) (spinal cord): Secondary | ICD-10-CM | POA: Diagnosis not present

## 2024-03-08 DIAGNOSIS — I340 Nonrheumatic mitral (valve) insufficiency: Secondary | ICD-10-CM | POA: Insufficient documentation

## 2024-03-08 DIAGNOSIS — I10 Essential (primary) hypertension: Secondary | ICD-10-CM | POA: Insufficient documentation

## 2024-03-08 DIAGNOSIS — R569 Unspecified convulsions: Secondary | ICD-10-CM | POA: Diagnosis not present

## 2024-03-08 DIAGNOSIS — K5900 Constipation, unspecified: Secondary | ICD-10-CM | POA: Insufficient documentation

## 2024-03-08 DIAGNOSIS — E785 Hyperlipidemia, unspecified: Secondary | ICD-10-CM | POA: Diagnosis not present

## 2024-03-08 DIAGNOSIS — Z79899 Other long term (current) drug therapy: Secondary | ICD-10-CM | POA: Insufficient documentation

## 2024-03-08 DIAGNOSIS — R159 Full incontinence of feces: Secondary | ICD-10-CM | POA: Diagnosis not present

## 2024-03-08 DIAGNOSIS — G8929 Other chronic pain: Secondary | ICD-10-CM | POA: Insufficient documentation

## 2024-03-08 HISTORY — PX: INTERSTIM IMPLANT REMOVAL: SHX5131

## 2024-03-08 SURGERY — REMOVAL, NEUROSTIMULATOR, SACRAL
Anesthesia: Monitor Anesthesia Care

## 2024-03-08 MED ORDER — SODIUM CHLORIDE 0.9 % IV SOLN: 250.0000 mL | INTRAVENOUS | Status: DC | PRN

## 2024-03-08 MED ORDER — MIDAZOLAM HCL 2 MG/2ML IJ SOLN: 0.5000 mg | Freq: Once | INTRAMUSCULAR | Status: DC | PRN

## 2024-03-08 MED ORDER — LACTATED RINGERS IV SOLN: INTRAVENOUS | Status: DC | PRN

## 2024-03-08 MED ORDER — SODIUM CHLORIDE 0.9% FLUSH: 3.0000 mL | Freq: Two times a day (BID) | INTRAVENOUS | Status: DC

## 2024-03-08 MED ORDER — OXYCODONE HCL 5 MG PO TABS: 5.0000 mg | ORAL_TABLET | ORAL | Status: DC | PRN

## 2024-03-08 MED ORDER — CEFAZOLIN SODIUM-DEXTROSE 2-4 GM/100ML-% IV SOLN
INTRAVENOUS | Status: AC
Start: 2024-03-08 — End: 2024-03-08
  Filled 2024-03-08: qty 100

## 2024-03-08 MED ORDER — ACETAMINOPHEN 500 MG PO TABS
1000.0000 mg | ORAL_TABLET | Freq: Once | ORAL | Status: AC
  Administered 2024-03-08: 1000 mg via ORAL

## 2024-03-08 MED ORDER — DEXAMETHASONE SODIUM PHOSPHATE 10 MG/ML IJ SOLN
INTRAMUSCULAR | Status: AC
  Filled 2024-03-08: qty 1

## 2024-03-08 MED ORDER — PROPOFOL 10 MG/ML IV BOLUS
INTRAVENOUS | Status: AC
  Filled 2024-03-08: qty 20

## 2024-03-08 MED ORDER — BUPIVACAINE-EPINEPHRINE (PF) 0.5% -1:200000 IJ SOLN
INTRAMUSCULAR | Status: AC
  Filled 2024-03-08: qty 120

## 2024-03-08 MED ORDER — BUPIVACAINE-EPINEPHRINE 0.5% -1:200000 IJ SOLN
INTRAMUSCULAR | Status: DC | PRN
Start: 2024-03-08 — End: 2024-03-08
  Administered 2024-03-08: 18 mL

## 2024-03-08 MED ORDER — OXYCODONE HCL 5 MG/5ML PO SOLN: 5.0000 mg | Freq: Once | ORAL | Status: DC | PRN

## 2024-03-08 MED ORDER — ACETAMINOPHEN 325 MG PO TABS: 650.0000 mg | ORAL_TABLET | ORAL | Status: DC | PRN

## 2024-03-08 MED ORDER — OXYCODONE HCL 5 MG PO TABS: 5.0000 mg | ORAL_TABLET | Freq: Once | ORAL | Status: DC | PRN

## 2024-03-08 MED ORDER — PROPOFOL 500 MG/50ML IV EMUL
INTRAVENOUS | Status: AC
  Filled 2024-03-08: qty 50

## 2024-03-08 MED ORDER — ONDANSETRON HCL 4 MG/2ML IJ SOLN
INTRAMUSCULAR | Status: DC | PRN
  Administered 2024-03-08: 4 mg via INTRAVENOUS

## 2024-03-08 MED ORDER — FENTANYL CITRATE (PF) 100 MCG/2ML IJ SOLN
INTRAMUSCULAR | Status: AC
  Filled 2024-03-08: qty 2

## 2024-03-08 MED ORDER — ONDANSETRON HCL 4 MG/2ML IJ SOLN
INTRAMUSCULAR | Status: AC
  Filled 2024-03-08: qty 2

## 2024-03-08 MED ORDER — FENTANYL CITRATE (PF) 100 MCG/2ML IJ SOLN
INTRAMUSCULAR | Status: DC | PRN
  Administered 2024-03-08 (×2): 25 ug via INTRAVENOUS
  Administered 2024-03-08 (×2): 12.5 ug via INTRAVENOUS

## 2024-03-08 MED ORDER — PROPOFOL 500 MG/50ML IV EMUL
INTRAVENOUS | Status: DC | PRN
  Administered 2024-03-08: 50 ug/kg/min via INTRAVENOUS

## 2024-03-08 MED ORDER — FENTANYL CITRATE (PF) 100 MCG/2ML IJ SOLN: 25.0000 ug | INTRAMUSCULAR | Status: DC | PRN

## 2024-03-08 MED ORDER — CEFAZOLIN SODIUM-DEXTROSE 2-4 GM/100ML-% IV SOLN
2.0000 g | INTRAVENOUS | Status: AC
  Administered 2024-03-08: 2 g via INTRAVENOUS

## 2024-03-08 MED ORDER — ACETAMINOPHEN 325 MG RE SUPP: 650.0000 mg | RECTAL | Status: DC | PRN

## 2024-03-08 MED ORDER — SODIUM CHLORIDE 0.9% FLUSH
3.0000 mL | INTRAVENOUS | Status: DC | PRN
Start: 2024-03-08 — End: 2024-03-08

## 2024-03-08 MED ORDER — PROPOFOL 10 MG/ML IV BOLUS
INTRAVENOUS | Status: DC | PRN
  Administered 2024-03-08 (×2): 20 mg via INTRAVENOUS

## 2024-03-08 MED ORDER — LIDOCAINE 2% (20 MG/ML) 5 ML SYRINGE
INTRAMUSCULAR | Status: AC
  Filled 2024-03-08: qty 5

## 2024-03-08 MED ORDER — LIDOCAINE HCL (CARDIAC) PF 100 MG/5ML IV SOSY
PREFILLED_SYRINGE | INTRAVENOUS | Status: DC | PRN
  Administered 2024-03-08: 40 mg via INTRAVENOUS

## 2024-03-08 MED ORDER — ACETAMINOPHEN 500 MG PO TABS
ORAL_TABLET | ORAL | Status: AC
  Filled 2024-03-08: qty 2

## 2024-03-08 SURGICAL SUPPLY — 30 items
BLADE CLIPPER SENSICLIP SURGIC (BLADE) IMPLANT
BLADE SURG 15 STRL LF DISP TIS (BLADE) ×1 IMPLANT
CHLORAPREP W/TINT 26 (MISCELLANEOUS) ×1 IMPLANT
COVER BACK TABLE 60X90IN (DRAPES) ×1 IMPLANT
COVER MAYO STAND STRL (DRAPES) ×1 IMPLANT
DERMABOND ADVANCED .7 DNX12 (GAUZE/BANDAGES/DRESSINGS) ×1 IMPLANT
DRAPE LAPAROTOMY TRNSV 102X78 (DRAPES) ×1 IMPLANT
DRAPE UTILITY XL STRL (DRAPES) ×1 IMPLANT
ELECTRODE REM PT RTRN 9FT ADLT (ELECTROSURGICAL) ×1 IMPLANT
GAUZE 4X4 16PLY ~~LOC~~+RFID DBL (SPONGE) ×1 IMPLANT
GAUZE SPONGE 4X4 12PLY STRL (GAUZE/BANDAGES/DRESSINGS) ×1 IMPLANT
GLOVE BIO SURGEON STRL SZ 6.5 (GLOVE) ×1 IMPLANT
GLOVE BIOGEL PI IND STRL 7.0 (GLOVE) ×1 IMPLANT
GLOVE INDICATOR 6.5 STRL GRN (GLOVE) ×1 IMPLANT
NDL HYPO 22X1.5 SAFETY MO (MISCELLANEOUS) ×1 IMPLANT
NEEDLE HYPO 22X1.5 SAFETY MO (MISCELLANEOUS) ×1 IMPLANT
NS IRRIG 500ML POUR BTL (IV SOLUTION) IMPLANT
PACK BASIN DAY SURGERY FS (CUSTOM PROCEDURE TRAY) ×1 IMPLANT
PENCIL SMOKE EVACUATOR (MISCELLANEOUS) ×1 IMPLANT
SLEEVE SCD COMPRESS KNEE MED (STOCKING) ×1 IMPLANT
SPIKE FLUID TRANSFER (MISCELLANEOUS) IMPLANT
SPONGE T-LAP 4X18 ~~LOC~~+RFID (SPONGE) IMPLANT
SUT VIC AB 3-0 PS2 18XBRD (SUTURE) IMPLANT
SUT VIC AB 3-0 SH 27X BRD (SUTURE) ×1 IMPLANT
SUT VIC AB 4-0 PS2 18 (SUTURE) ×1 IMPLANT
SYR BULB IRRIG 60ML STRL (SYRINGE) ×1 IMPLANT
SYR CONTROL 10ML LL (SYRINGE) ×1 IMPLANT
TOWEL OR 17X24 6PK STRL BLUE (TOWEL DISPOSABLE) ×1 IMPLANT
TUBE CONNECTING 12X1/4 (SUCTIONS) IMPLANT
YANKAUER SUCT BULB TIP NO VENT (SUCTIONS) IMPLANT

## 2024-03-08 NOTE — Discharge Instructions (Addendum)
 GENERAL SURGERY: POST OP INSTRUCTIONS  DIET: Follow a light bland diet the first 24 hours after arrival home, such as soup, liquids, crackers, etc.  Be sure to include lots of fluids daily.  Avoid fast food or heavy meals as your are more likely to get nauseated.   Take your usually prescribed home medications unless otherwise directed. PAIN CONTROL: Pain is best controlled by a usual combination of three different methods TOGETHER: Ice/Heat Over the counter pain medication Prescription pain medication Most patients will experience some swelling and bruising around the incisions.  Ice packs or heating pads (30-60 minutes up to 6 times a day) will help. Use ice for the first few days to help decrease swelling and bruising, then switch to heat to help relax tight/sore spots and speed recovery.  Some people prefer to use ice alone, heat alone, alternating between ice & heat.  Experiment to what works for you.  Swelling and bruising can take several weeks to resolve.   It is helpful to take an over-the-counter pain medication regularly for the first few weeks.  Choose one of the following that works best for you: Naproxen (Aleve, etc)  Two 220mg  tabs twice a day Ibuprofen (Advil, etc) Three 200mg  tabs four times a day (every meal & bedtime) A  prescription for pain medication (such as Percocet, oxycodone , hydrocodone, etc) should be given to you upon discharge.  Take your pain medication as prescribed.  If you are having problems/concerns with the prescription medicine (does not control pain, nausea, vomiting, rash, itching, etc), please call us  [phone] to see if we need to switch you to a different pain medicine that will work better for you and/or control your side effect better. If you need a refill on your pain medication, please contact your pharmacy.  They will contact our office to request authorization. Prescriptions will not be filled after 5 pm or on week-ends. Avoid getting constipated.   Between the surgery and the pain medications, it is common to experience some constipation.  Increasing fluid intake and taking a fiber supplement (such as Metamucil, Citrucel, FiberCon, MiraLax, etc) 1-2 times a day regularly will usually help prevent this problem from occurring.  A mild laxative (prune juice, Milk of Magnesia, MiraLax, etc) should be taken according to package directions if there are no bowel movements after 48 hours.   Wash / shower every day.  You may shower over the dressings as they are waterproof.  Continue to shower over incision(s) after the dressing is off.  You may place a dressing/Band-Aid to cover the incision for comfort if you wish. Ok to get wet.  No tub baths for 2 weeks  ACTIVITIES as tolerated:   You may resume regular (light) daily activities beginning the next day--such as daily self-care, walking, climbing stairs--gradually increasing activities as tolerated.  If you can walk 30 minutes without difficulty, it is safe to try more intense activity such as jogging, treadmill, bicycling, low-impact aerobics, swimming, etc. Save the most intensive and strenuous activity for last such as sit-ups, heavy lifting, contact sports, etc  Refrain from any heavy lifting or straining until you are off narcotics for pain control.   DO NOT PUSH THROUGH PAIN.  Let pain be your guide: If it hurts to do something, don't do it.  Pain is your body warning you to avoid that activity for another week until the pain goes down. You may drive when you are no longer taking prescription pain medication, you can comfortably  wear a seatbelt, and you can safely maneuver your car and apply brakes. You may have sexual intercourse when it is comfortable.  FOLLOW UP in our office Please call [facility] at [phone] to set up an appointment to see your surgeon in the office for a follow-up appointment approximately 2-3 weeks after your surgery. Make sure that you call for this appointment the day you  arrive home to insure a convenient appointment time. 9. IF YOU HAVE DISABILITY OR FAMILY LEAVE FORMS, BRING THEM TO THE OFFICE FOR PROCESSING.  DO NOT GIVE THEM TO YOUR DOCTOR.   WHEN TO CALL US  [phone]: Poor pain control Reactions / problems with new medications (rash/itching, nausea, etc)  Fever over 101.5 F (38.5 C) Worsening swelling or bruising Continued bleeding from incision. Increased pain, redness, or drainage from the incision   The clinic staff is available to answer your questions during regular business hours (8:30am-5pm).  Please don't hesitate to call and ask to speak to one of our nurses for clinical concerns.   If you have a medical emergency, go to the nearest emergency room or call 911.  A surgeon from [facility] is always on call at the [facility], [place] [address], [place], [place]  [address] ? MAIN: [phone] ? TOLL FREE: [phone] ?  FAX [phone] [email or web address]     Post Anesthesia Home Care Instructions  Activity: Get plenty of rest for the remainder of the day. A responsible individual must stay with you for 24 hours following the procedure.  For the next 24 hours, DO NOT: -Drive a car -Advertising copywriter -Drink alcoholic beverages -Take any medication unless instructed by your physician -Make any legal decisions or sign important papers.  Meals: Start with liquid foods such as gelatin or soup. Progress to regular foods as tolerated. Avoid greasy, spicy, heavy foods. If nausea and/or vomiting occur, drink only clear liquids until the nausea and/or vomiting subsides. Call your physician if vomiting continues.  Special Instructions/Symptoms: Your throat may feel dry or sore from the anesthesia or the breathing tube placed in your throat during surgery. If this causes discomfort, gargle with warm salt water . The discomfort should disappear within 24 hours.  If  you had a scopolamine patch placed behind your ear for the management of post- operative nausea and/or vomiting:  1. The medication in the patch is effective for 72 hours, after which it should be removed.  Wrap patch in a tissue and discard in the trash. Wash hands thoroughly with soap and water . 2. You may remove the patch earlier than 72 hours if you experience unpleasant side effects which may include dry mouth, dizziness or visual disturbances. 3. Avoid touching the patch. Wash your hands with soap and water  after contact with the patch.     No tylenol  until after 12:40pm today.

## 2024-03-08 NOTE — Op Note (Signed)
 03/08/2024  8:02 AM  PATIENT:  Carol [name]  78 y.o. female  Patient Care Team: Windell Hasty, DO as PCP - General (Internal Medicine) Thukkani, Arun K, MD as PCP - Cardiology (Cardiology) Jhonny Moss, MD as Consulting Physician (Neurology)  PRE-OPERATIVE DIAGNOSIS:  FECAL INCONTINANCE  POST-OPERATIVE DIAGNOSIS:  FECAL INCONTINANCE  PROCEDURE:   REMOVAL SACRAL NEUROSTIMULATOR     Surgeon(s): Joyce Nixon, MD  ASSISTANT: none   ANESTHESIA:   local and MAC  SPECIMEN:  No Specimen  DISPOSITION OF SPECIMEN:  N/A  COUNTS:  YES  PLAN OF CARE: Discharge to home after PACU  PATIENT DISPOSITION:  PACU - hemodynamically stable.  INDICATION: 78 y.o. F with SNS for fecal incontinence.  She has requested removal as she requires an MRI and she does not use it any more   OR FINDINGS: intact wire and device  DESCRIPTION: the patient was identified in the preoperative holding area and taken to the OR where they were laid on the operating room table.  MAC anesthesia was induced without difficulty. The patient was then positioned in prone jackknife position with buttocks gently taped apart.  The patient was then prepped and draped in usual sterile fashion.  SCDs were noted to be in place prior to the initiation of anesthesia. A surgical timeout was performed indicating the correct patient, procedure, positioning and need for preoperative antibiotics.  A field block was performed using Marcaine  with epinephrine .    I began by making an incision with a 15 blade scalpel through the previous scar of the patient's left gluteus.  This was carried down through the subcutaneous tissue using sharp dissection until the battery cavity could be identified.  I incised the cavity and manipulated the battery up out of the wound.  I continued to dissect around the battery until this was completely free.  I then turned my attention to the wire insertion site at the patient's midline.  A small  incision was made over this site and the wire was identified and clamped with a hemostat.  I then cut the wire from the battery and pulled this out through the midline incision.  I was then able to bluntly dissect down to the level of the sacrum and gently pulled the wire out of the foramen.  The wire remained intact.  Hemostasis was good.  The battery cavity was closed using a 3-0 Vicryl suture.  The skin of both incisions was closed using a 4-0 running subcuticular suture and Dermabond.  Patient tolerated the procedure well.  She was awakened from anesthesia and sent to the postanesthesia care unit in stable condition.  All counts were correct per operating room staff.  Fernande Howells, MD  Colorectal and General Surgery [facility]

## 2024-03-08 NOTE — Anesthesia Procedure Notes (Signed)
 Procedure Name: MAC Date/Time: 03/08/2024 7:29 AM  Performed by: Lucky Sable, CRNAPre-anesthesia Checklist: Timeout performed, Patient being monitored, Suction available, Emergency Drugs available and Patient identified Patient Re-evaluated:Patient Re-evaluated prior to induction Oxygen Delivery Method: Simple face mask Preoxygenation: Pre-oxygenation with 100% oxygen Induction Type: IV induction Placement Confirmation: breath sounds checked- equal and bilateral, CO2 detector and positive ETCO2

## 2024-03-08 NOTE — Anesthesia Postprocedure Evaluation (Signed)
 Anesthesia Post Note  Patient: Carol [name]  Procedure(s) Performed: REMOVAL, NEUROSTIMULATOR, SACRAL     Patient location during evaluation: Phase II Anesthesia Type: MAC Level of consciousness: awake and alert, oriented and patient cooperative Pain management: pain level controlled Vital Signs Assessment: post-procedure vital signs reviewed and stable Respiratory status: spontaneous breathing, nonlabored ventilation and respiratory function stable Cardiovascular status: stable and blood pressure returned to baseline Postop Assessment: no apparent nausea or vomiting, able to ambulate and adequate PO intake Anesthetic complications: no  No notable events documented.  Last Vitals:  Vitals:   03/08/24 0805 03/08/24 0815  BP: (!) 149/77 (!) 146/73  Pulse: 67 65  Resp: 16 (!) 9  Temp: 36.4 C   SpO2: 97% 96%    Last Pain:  Vitals:   03/08/24 0815  TempSrc:   PainSc: 0-No pain                 Milena Liggett,E. Glory Graefe

## 2024-03-08 NOTE — H&P (Signed)
 REFERRING PHYSICIAN:  Windell Hasty   PROVIDER:  Denese Finn, MD   MRN: [id number] DOB: Jul 24, 1946    Subjective    Chief Complaint: New Consultation (Removal of Bowel Stimulator)       History of Present Illness: Carol [name] is a 78 y.o. female who is seen today as an office consultation at the request of Dr. Maryl Snook for evaluation of New Consultation (Removal of Bowel Stimulator) .   78 y.o. F with urine and fecal incontinence who underwent SNS placement March 2021.  She has since developed a lot of neck pain and is needing to get an MRI.  Pt states that she currently doesn't use the device and it is switched off.  Currently fecal incontinence is treated by constipation.      Review of Systems: A complete review of systems was obtained from the patient.  I have reviewed this information and discussed as appropriate with the patient.  See HPI as well for other ROS.       Medical History:     Past Medical History:  Diagnosis Date   Seizures (CMS/HHS-HCC)        There is no problem list on file for this patient.     History reviewed. No pertinent surgical history.    No Known Allergies         Current Outpatient Medications on File Prior to Visit  Medication Sig Dispense Refill   butalbital-acetaminophen -caffeine (FIORICET) 50-325-40 mg tablet Take 1 tablet by mouth every 6 (six) hours as needed       erythromycin (ROMYCIN) ophthalmic ointment         gabapentin (NEURONTIN) 300 MG capsule 1 CAPSULE BY MOUTH EVERY EIGHT HOURS       NURTEC ODT 75 mg disintegrating tablet PLACE/DISSOLVE 1 TABLET ON TONGUE AS NEEDED. DO NO NOT TAKE MORE THAN 1 TABLET/24 HOURS 30       OXcarbazepine  (TRILEPTAL ) 300 MG tablet Take 1 tablet by mouth 2 (two) times daily       traMADoL  (ULTRAM ) 50 mg tablet Take 1 tablet by mouth every 6 (six) hours as needed       zonisamide  (ZONEGRAN ) 100 MG capsule          No current facility-administered medications on file prior to  visit.      No family history on file.    Social History       Tobacco Use  Smoking Status Not on file  Smokeless Tobacco Not on file      Social History        Socioeconomic History   Marital status: Married    Social Drivers of Health        Financial Resource Strain: Low Risk  (12/07/2021)    Received from [facility]    Overall Financial Resource Strain (CARDIA)     Difficulty of Paying Living Expenses: Not hard at all  Food Insecurity: No Food Insecurity (12/07/2021)    Received from [facility]    Hunger Vital Sign     Worried About Running Out of Food in the Last Year: Never true     Ran Out of Food in the Last Year: Never true  Transportation Needs: No Transportation Needs (12/07/2021)    Received from [facility] - Transportation     Lack of Transportation (Medical): No     Lack of Transportation (Non-Medical): No  Physical Activity: Sufficiently Active (12/07/2021)    Received from  [place]    Exercise Vital Sign     Days of Exercise per Week: 7 days     Minutes of Exercise per Session: 120 min  Stress: No Stress Concern Present (12/07/2021)    Received from [facility] - Occupational Stress Questionnaire     Feeling of Stress : Not at all  Social Connections: Unknown (02/04/2022)    Received from [facility]    Social Network  Recent Concern: Social Connections - Moderately Isolated (12/07/2021)    Received from [facility]    Social Connection and Isolation Panel [NHANES]     Frequency of Communication with Friends and Family: More than three times a week     Frequency of Social Gatherings with Friends and Family: More than three times a week     Attends Religious Services: Never     Database administrator or Organizations: No     Attends Banker Meetings: Never     Marital Status: Married  Housing Stability: Unknown (12/25/2023)    Housing Stability Vital Sign     Homeless in the  Last Year: No      Objective:      Vitals:   03/08/24 0636  BP: (!) 194/90  Pulse: 70  Resp: 16  Temp: (!) 97.1 F (36.2 C)  SpO2: 100%    Exam Gen: NAD CV: RRR Pulm: CTA Abd: soft       Labs, Imaging and Diagnostic Testing: Previous op note, anal manometry notes reviewed   Assessment and Plan:  Diagnoses and all orders for this visit:   Incontinence of feces, unspecified fecal incontinence type       Pt with SNS that is not being used and she would like to get an MRI.  Will remove device.  Risks include bleeding, pain and infection.  All questions answered.     Fernande Howells, MD Colon and Rectal Surgery [facility]

## 2024-03-08 NOTE — Transfer of Care (Signed)
 Immediate Anesthesia Transfer of Care Note  Patient: Carol [name]  Procedure(s) Performed: Procedure(s) (LRB): REMOVAL, NEUROSTIMULATOR, SACRAL (N/A)  Patient Location: PACU  Anesthesia Type: MAC  Level of Consciousness: awake, sedated, patient cooperative and responds to stimulation, sleepy stable   Airway & Oxygen Therapy: Patient Spontanous Breathing and Patient connected to FM 02   Post-op Assessment: Report given to PACU RN, Post -op Vital signs reviewed and stable and Patient sleepy   Post vital signs: Reviewed and stable  Complications: No apparent anesthesia complications

## 2024-03-09 ENCOUNTER — Encounter (HOSPITAL_BASED_OUTPATIENT_CLINIC_OR_DEPARTMENT_OTHER): Payer: Self-pay | Admitting: General Surgery

## 2024-03-19 ENCOUNTER — Telehealth: Payer: Self-pay | Admitting: Neurology

## 2024-03-19 DIAGNOSIS — M961 Postlaminectomy syndrome, not elsewhere classified: Secondary | ICD-10-CM | POA: Diagnosis not present

## 2024-03-19 DIAGNOSIS — M9901 Segmental and somatic dysfunction of cervical region: Secondary | ICD-10-CM | POA: Diagnosis not present

## 2024-03-19 DIAGNOSIS — G894 Chronic pain syndrome: Secondary | ICD-10-CM | POA: Diagnosis not present

## 2024-03-19 DIAGNOSIS — M47812 Spondylosis without myelopathy or radiculopathy, cervical region: Secondary | ICD-10-CM | POA: Diagnosis not present

## 2024-03-19 NOTE — Telephone Encounter (Signed)
 Pt called no answer left a voice mail when she calls back need to find out if the only medication Dr Georjean prescribes for her is the Oxcarbazepine 

## 2024-03-19 NOTE — Telephone Encounter (Signed)
 Pls confirm, the only medication I prescribe for her is the Oxcarbazepine . Thanks

## 2024-03-19 NOTE — Telephone Encounter (Signed)
 Pt called in stating she is going to be taking a trip to [place] and in order to travel with her medication she needs  letter from Dr. Georjean stating what Dr. Georjean prescribes and why. She would like it to be mailed to her. She leaves on 04/06/24. I verified her address is correct.

## 2024-03-20 NOTE — Telephone Encounter (Signed)
 The only medication prescribed by Dr Georjean is Oxcarbazepine 

## 2024-03-20 NOTE — Telephone Encounter (Signed)
 Pt called and LM - she is returning a call to heather. Would like to speak with her

## 2024-03-21 ENCOUNTER — Encounter: Payer: Self-pay | Admitting: Neurology

## 2024-03-21 NOTE — Telephone Encounter (Signed)
 Done, ready to mail, thanks

## 2024-03-22 NOTE — Telephone Encounter (Signed)
 Letter has been placed in the mail

## 2024-04-03 DIAGNOSIS — M5481 Occipital neuralgia: Secondary | ICD-10-CM | POA: Diagnosis not present

## 2024-04-03 DIAGNOSIS — M9901 Segmental and somatic dysfunction of cervical region: Secondary | ICD-10-CM | POA: Diagnosis not present

## 2024-04-03 DIAGNOSIS — M961 Postlaminectomy syndrome, not elsewhere classified: Secondary | ICD-10-CM | POA: Diagnosis not present

## 2024-04-03 DIAGNOSIS — G894 Chronic pain syndrome: Secondary | ICD-10-CM | POA: Diagnosis not present

## 2024-04-18 DIAGNOSIS — M961 Postlaminectomy syndrome, not elsewhere classified: Secondary | ICD-10-CM | POA: Diagnosis not present

## 2024-04-18 DIAGNOSIS — G894 Chronic pain syndrome: Secondary | ICD-10-CM | POA: Diagnosis not present

## 2024-04-18 DIAGNOSIS — M9901 Segmental and somatic dysfunction of cervical region: Secondary | ICD-10-CM | POA: Diagnosis not present

## 2024-04-18 DIAGNOSIS — M47812 Spondylosis without myelopathy or radiculopathy, cervical region: Secondary | ICD-10-CM | POA: Diagnosis not present

## 2024-04-30 DIAGNOSIS — L820 Inflamed seborrheic keratosis: Secondary | ICD-10-CM | POA: Diagnosis not present

## 2024-04-30 DIAGNOSIS — L821 Other seborrheic keratosis: Secondary | ICD-10-CM | POA: Diagnosis not present

## 2024-05-01 DIAGNOSIS — R92333 Mammographic heterogeneous density, bilateral breasts: Secondary | ICD-10-CM | POA: Diagnosis not present

## 2024-05-01 DIAGNOSIS — Z1231 Encounter for screening mammogram for malignant neoplasm of breast: Secondary | ICD-10-CM | POA: Diagnosis not present

## 2024-05-03 DIAGNOSIS — R519 Headache, unspecified: Secondary | ICD-10-CM | POA: Diagnosis not present

## 2024-05-03 DIAGNOSIS — I10 Essential (primary) hypertension: Secondary | ICD-10-CM | POA: Diagnosis not present

## 2024-05-07 DIAGNOSIS — M9901 Segmental and somatic dysfunction of cervical region: Secondary | ICD-10-CM | POA: Diagnosis not present

## 2024-05-07 DIAGNOSIS — M961 Postlaminectomy syndrome, not elsewhere classified: Secondary | ICD-10-CM | POA: Diagnosis not present

## 2024-05-07 DIAGNOSIS — M5481 Occipital neuralgia: Secondary | ICD-10-CM | POA: Diagnosis not present

## 2024-05-07 DIAGNOSIS — G894 Chronic pain syndrome: Secondary | ICD-10-CM | POA: Diagnosis not present

## 2024-05-08 DIAGNOSIS — K5900 Constipation, unspecified: Secondary | ICD-10-CM | POA: Diagnosis not present

## 2024-05-08 DIAGNOSIS — D509 Iron deficiency anemia, unspecified: Secondary | ICD-10-CM | POA: Diagnosis not present

## 2024-05-09 ENCOUNTER — Other Ambulatory Visit: Payer: Self-pay | Admitting: Neurology

## 2024-05-19 DIAGNOSIS — M9901 Segmental and somatic dysfunction of cervical region: Secondary | ICD-10-CM | POA: Diagnosis not present

## 2024-05-19 DIAGNOSIS — M961 Postlaminectomy syndrome, not elsewhere classified: Secondary | ICD-10-CM | POA: Diagnosis not present

## 2024-05-19 DIAGNOSIS — M47812 Spondylosis without myelopathy or radiculopathy, cervical region: Secondary | ICD-10-CM | POA: Diagnosis not present

## 2024-05-19 DIAGNOSIS — G894 Chronic pain syndrome: Secondary | ICD-10-CM | POA: Diagnosis not present

## 2024-05-24 ENCOUNTER — Ambulatory Visit: Payer: Medicare HMO | Admitting: Neurology

## 2024-06-05 DIAGNOSIS — H52221 Regular astigmatism, right eye: Secondary | ICD-10-CM | POA: Diagnosis not present

## 2024-06-05 DIAGNOSIS — H524 Presbyopia: Secondary | ICD-10-CM | POA: Diagnosis not present

## 2024-06-05 DIAGNOSIS — H5213 Myopia, bilateral: Secondary | ICD-10-CM | POA: Diagnosis not present

## 2024-06-05 DIAGNOSIS — H53143 Visual discomfort, bilateral: Secondary | ICD-10-CM | POA: Diagnosis not present

## 2024-06-05 DIAGNOSIS — H2513 Age-related nuclear cataract, bilateral: Secondary | ICD-10-CM | POA: Diagnosis not present

## 2024-06-07 DIAGNOSIS — M810 Age-related osteoporosis without current pathological fracture: Secondary | ICD-10-CM | POA: Diagnosis not present

## 2024-06-07 DIAGNOSIS — E559 Vitamin D deficiency, unspecified: Secondary | ICD-10-CM | POA: Diagnosis not present

## 2024-06-07 DIAGNOSIS — D509 Iron deficiency anemia, unspecified: Secondary | ICD-10-CM | POA: Diagnosis not present

## 2024-06-07 DIAGNOSIS — Z7184 Encounter for health counseling related to travel: Secondary | ICD-10-CM | POA: Diagnosis not present

## 2024-06-07 DIAGNOSIS — K5900 Constipation, unspecified: Secondary | ICD-10-CM | POA: Diagnosis not present

## 2024-06-07 DIAGNOSIS — G40909 Epilepsy, unspecified, not intractable, without status epilepticus: Secondary | ICD-10-CM | POA: Diagnosis not present

## 2024-06-07 DIAGNOSIS — R152 Fecal urgency: Secondary | ICD-10-CM | POA: Diagnosis not present

## 2024-06-07 DIAGNOSIS — R519 Headache, unspecified: Secondary | ICD-10-CM | POA: Diagnosis not present

## 2024-06-07 DIAGNOSIS — E46 Unspecified protein-calorie malnutrition: Secondary | ICD-10-CM | POA: Diagnosis not present

## 2024-06-10 DIAGNOSIS — D509 Iron deficiency anemia, unspecified: Secondary | ICD-10-CM | POA: Diagnosis not present

## 2024-06-10 DIAGNOSIS — K2970 Gastritis, unspecified, without bleeding: Secondary | ICD-10-CM | POA: Diagnosis not present

## 2024-06-10 DIAGNOSIS — K2930 Chronic superficial gastritis without bleeding: Secondary | ICD-10-CM | POA: Diagnosis not present

## 2024-06-10 DIAGNOSIS — K2980 Duodenitis without bleeding: Secondary | ICD-10-CM | POA: Diagnosis not present

## 2024-06-11 DIAGNOSIS — M961 Postlaminectomy syndrome, not elsewhere classified: Secondary | ICD-10-CM | POA: Diagnosis not present

## 2024-06-11 DIAGNOSIS — G894 Chronic pain syndrome: Secondary | ICD-10-CM | POA: Diagnosis not present

## 2024-06-11 DIAGNOSIS — Z79891 Long term (current) use of opiate analgesic: Secondary | ICD-10-CM | POA: Diagnosis not present

## 2024-06-11 DIAGNOSIS — M9901 Segmental and somatic dysfunction of cervical region: Secondary | ICD-10-CM | POA: Diagnosis not present

## 2024-06-11 DIAGNOSIS — M5481 Occipital neuralgia: Secondary | ICD-10-CM | POA: Diagnosis not present

## 2024-06-12 DIAGNOSIS — K2930 Chronic superficial gastritis without bleeding: Secondary | ICD-10-CM | POA: Diagnosis not present

## 2024-06-18 ENCOUNTER — Other Ambulatory Visit: Payer: Self-pay | Admitting: Internal Medicine

## 2024-06-18 DIAGNOSIS — R519 Headache, unspecified: Secondary | ICD-10-CM

## 2024-06-19 DIAGNOSIS — M9901 Segmental and somatic dysfunction of cervical region: Secondary | ICD-10-CM | POA: Diagnosis not present

## 2024-06-19 DIAGNOSIS — M47812 Spondylosis without myelopathy or radiculopathy, cervical region: Secondary | ICD-10-CM | POA: Diagnosis not present

## 2024-06-19 DIAGNOSIS — G894 Chronic pain syndrome: Secondary | ICD-10-CM | POA: Diagnosis not present

## 2024-06-19 DIAGNOSIS — M961 Postlaminectomy syndrome, not elsewhere classified: Secondary | ICD-10-CM | POA: Diagnosis not present

## 2024-06-26 ENCOUNTER — Encounter: Payer: Self-pay | Admitting: Physical Therapy

## 2024-06-26 ENCOUNTER — Other Ambulatory Visit: Payer: Self-pay

## 2024-06-26 ENCOUNTER — Ambulatory Visit: Attending: Internal Medicine | Admitting: Physical Therapy

## 2024-06-26 DIAGNOSIS — M542 Cervicalgia: Secondary | ICD-10-CM | POA: Diagnosis not present

## 2024-06-26 DIAGNOSIS — R29898 Other symptoms and signs involving the musculoskeletal system: Secondary | ICD-10-CM | POA: Insufficient documentation
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
## 2024-07-12 ENCOUNTER — Ambulatory Visit

## 2024-07-12 DIAGNOSIS — M542 Cervicalgia: Secondary | ICD-10-CM

## 2024-07-12 DIAGNOSIS — R29898 Other symptoms and signs involving the musculoskeletal system: Secondary | ICD-10-CM

## 2024-07-12 NOTE — Therapy (Signed)
 OUTPATIENT PHYSICAL THERAPY NEURO TREATMENT   Patient Name: Carol [name] MRN: [id number] DOB:[birth year], 78 y.o., female Today's Date: 07/12/2024   PCP: Valentin Skates, DO  REFERRING PROVIDER: Valentin Skates, DO   END OF SESSION:  PT End of Session - 07/12/24 0846     Visit Number 2    Number of Visits 13    Date for Recertification  08/09/24    Authorization Type HTA    Progress Note Due on Visit 10    PT Start Time 0845    PT Stop Time 0930    PT Time Calculation (min) 45 min    Activity Tolerance Patient tolerated treatment well    Behavior During Therapy [facility] for tasks assessed/performed          Past Medical History:  Diagnosis Date   Cervical spondylosis without myelopathy    Chronic neck pain    Fecal incontinence    History of asthma    per pt has issues with [place] smoke, no issues since age 21's when smoking was ban inside   History of kidney stones    Idiopathic generalized epilepsy [facility]) neurologist---  dr georjean   new onset seizure april and august 2020;  dx 10/ 2020    (11-25-2019 pt stated last episode 01/ 19/ 2021 was noctural)   Mixed stress and urge urinary incontinence    Wears contact lenses    Past Surgical History:  Procedure Laterality Date   ANAL RECTAL MANOMETRY N/A 10/09/2019   Procedure: ANAL MANOMETRY;  Surgeon: Debby Hila, MD;  Location: [facility];  Service: Endoscopy;  Laterality: N/A;   DILATION AND CURETTAGE OF UTERUS     ELBOW SURGERY Right    INTERSTIM IMPLANT REMOVAL N/A 03/08/2024   Procedure: REMOVAL, NEUROSTIMULATOR, SACRAL;  Surgeon: Debby Hila, MD;  Location: [facility];  Service: General;  Laterality: N/A;  REMOVAL OF SACRAL NERVE STIMULATOR   NECK SURGERY     x  6 3 plates in neck   TOE SURGERY Left    x 4    Patient Active Problem List   Diagnosis Date Noted   Localization-related idiopathic epilepsy and epileptic syndromes with seizures of localized onset, not intractable, without  status epilepticus (HCC) 04/27/2020   Facet arthropathy, cervical 10/08/2018   S/P cervical spinal fusion 09/24/2018   Incontinence of feces 05/01/2018   Hyperlipidemia 05/01/2018   Internal hemorrhoids 10/11/2017   Neuropathic pain of chest 08/14/2017   Atrophy of vagina 06/26/2017   Mixed stress and urge urinary incontinence 06/26/2017   Osteopenia 06/26/2017   Postmenopausal bleeding 06/26/2017   Neck pain 06/26/2017    ONSET DATE: 06/10/2024 (MD referral)  REFERRING DIAG: R51.9 (ICD-10-CM) - Headache, unspecified   THERAPY DIAG:  Cervicalgia  Other symptoms and signs involving the musculoskeletal system  Rationale for Evaluation and Treatment: Rehabilitation  SUBJECTIVE:  SUBJECTIVE STATEMENT: Ongoing neck pain 5/10 more on left side with radiating neck/head pain and behind left eye. Using ice at home. Reports poor tolerance to massage in the past. Reports the HEP movements seem to have a lasting pain for several hours.  Pt accompanied by: self  PERTINENT HISTORY: occipital neuralgia, chronic pain syndrome, post laminectomy syndrome  PAIN:  Are you having pain? Yes: NPRS scale: 5/10 (worst 8/10; best 3/10) Pain location: L suboccipital pain Pain description: aches, hurts, headaches Aggravating factors: stress Relieving factors: pain medications  PRECAUTIONS: Other: None per patient report  RED FLAGS: None   WEIGHT BEARING RESTRICTIONS: No  FALLS: Has patient fallen in last 6 months? 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  PATIENT GOALS: To help with the headaches to release the pain  OBJECTIVE:   TODAY'S TREATMENT:  07/12/24 Activity Comments  Soft tissue mobilization Suboccipital release Scalene release Gentle PIVM PA mobilizations for flexion/extension  Supine cervical extension isometric 2x10 3 sec hold  HEP review Cues for position and decreased force/ROM             PATIENT EDUCATION: Education details: Eval results, POC, HEP initiated Person educated: Patient Education method: Explanation, Demonstration, and Handouts Education comprehension: verbalized understanding, returned demonstration, and needs further education  HOME EXERCISE PROGRAM: Access Code: [email or web address] URL: [email or web address] Date: 06/26/2024 Prepared by: [facility] - Outpatient  Rehab - [facility]  Exercises - Supine Cervical Retraction with Towel  - 1 x daily - 7 x weekly - 1 sets - 5-10 reps - Neck Retraction  - 1-2 x daily - 7 x weekly - 1 sets - 5-10 reps - 10 sec hold - Supine Isometric Neck Extension  - 1 x daily - 7 x weekly - 3 sets - 10 reps - 3-5 sec hold Note: Objective measures were completed at Evaluation unless otherwise noted.  DIAGNOSTIC FINDINGS: NA; MRI cervical spine and CT brain scheduled  COGNITION: Overall cognitive status: Within functional limits for tasks assessed   SENSATION: Light touch: WFL  COORDINATION: WFL  BUE strength:  equal and WFL bilaterally  POSTURE: tends to hold neck in slight lateral flex towards R  NECK ROM: limitations due to pain, previous hx of surgeries Flexion 14 degrees Extension 12 degrees R rotation 24 degrees L rotation 20 degrees R lateral flexion 10 L lateral flexion 20  PALPATION: Pt tender to palpation with trigger points noted (pt very sensitive) along Bilat upper traps and L occiput/suboccipital area  GAIT: Findings: NT   PATIENT SURVEYS:  NDI:  NECK DISABILITY INDEX  Date: 06/26/2024 Score  Pain intensity 3 = The pain is fairly severe at the moment  2. Personal care (washing, dressing, etc.) 0 = I can look after myself  normally without causing extra pain  3. Lifting 1 =  I can lift heavy weights but it gives extra pain  4. Reading (6) 1 = I can read as much as I want to with slight pain in my neck  5. Headaches (4) 5 = I have headaches almost all the time  6. Concentration (9) 0 =  I can concentrate fully when I want to with no difficulty  7. Work 0 =  I can do as much work as I want to  8. Driving (10) 1 =  I can drive my car as long as I want with  slight pain in my neck  9. Sleeping (8) 3 =  My sleep is moderately disturbed (2-3 hrs sleepless)  10. Recreation (5) 1 =  I am able to engage in all my recreation activities, with some pain in my neck  Total 15/50   Minimum Detectable Change (90% confidence): 5 points or 10% points                                                                                                                              TREATMENT DATE: 06/26/2024      GOALS: Goals reviewed with patient? Yes  SHORT TERM GOALS: Target date: 07/12/2024  Pt will be independent with HEP for improved pain, headaches, neck flexibility. Baseline: Goal status: INITIAL  LONG TERM GOALS: Target date: 08/09/2024  Pt will be independent with progression of HEP for improved pain, headaches, neck flexibility. Baseline:  Goal status: INITIAL  2.  Neck flexibility/ROM to improve by at least 5 degrees for flexion, extension, rotation for improved participation in ADLs. Baseline:  Goal status: INITIAL  3.  NDI score to improve by 5 points to demo decreased pain interfering with daily activities. Baseline: 15 Goal status: INITIAL  4.  Pt will report 50% reduction in headaches limiting functional mobility and daily activities. Baseline:  Goal status: INITIAL   ASSESSMENT:  CLINICAL IMPRESSION: Soft tissue mobilization to address trigger points/soft tissue restrictions/pain with good tolerance reported throughout with greater sensitivity to scalenes vs subocciput. Instructed in gentle  isometric for cervical extensors to improve postural endurance and review to HEP for refinement  OBJECTIVE IMPAIRMENTS: decreased ROM, impaired flexibility, postural dysfunction, and pain.   ACTIVITY LIMITATIONS: lifting and sleeping  PARTICIPATION LIMITATIONS: driving and community activity  PERSONAL FACTORS: 3+ comorbidities: see above PMH (multiple neck surgeries) are also affecting patient's functional outcome.   REHAB POTENTIAL: Good  CLINICAL DECISION MAKING: Stable/uncomplicated  EVALUATION COMPLEXITY: Low  PLAN:  PT FREQUENCY: 1-2x/week  PT DURATION: 6 weeks plus eval visit  PLANNED INTERVENTIONS: 97110-Therapeutic exercises, 97530- Therapeutic activity, [id number]- Neuromuscular re-education, 97535- Self Care, 02859- Manual therapy, G0283- Electrical stimulation (unattended), 20560 (1-2 muscles), 20561 (3+ muscles)- Dry Needling, Patient/Family education, and Taping  PLAN FOR NEXT SESSION: Review initial HEP and progress for gentle stretch and flexibility, gentle suboccipital release; STM and education on potential ways to lessen pain-TENS/dry needling?   Jonette MARLA Sandifer, PT 07/12/2024, 8:47 AM  [facility] at [facility] [address] [place], [address] [place], [place] [address] Phone # [phone] Fax # [phone]

## 2024-07-17 ENCOUNTER — Ambulatory Visit

## 2024-07-17 DIAGNOSIS — M542 Cervicalgia: Secondary | ICD-10-CM

## 2024-07-17 DIAGNOSIS — R29898 Other symptoms and signs involving the musculoskeletal system: Secondary | ICD-10-CM

## 2024-07-17 NOTE — Therapy (Signed)
OUTPATIENT PHYSICAL THERAPY NEURO TREATMENT   Patient Name: Carol [name] MRN: [id number] DOB:Jan 31, 1946, 78 y.o., female Today's Date: 07/17/2024   PCP: Valentin Skates, DO  REFERRING PROVIDER: Valentin Skates, DO   END OF SESSION:  PT End of Session - 07/17/24 0849     Visit Number 3    Number of Visits 13    Date for Recertification  08/09/24    Authorization Type HTA    Progress Note Due on Visit 10    PT Start Time [phone]    PT Stop Time 0930    PT Time Calculation (min) 44 min    Activity Tolerance Patient tolerated treatment well    Behavior During Therapy [facility] for tasks assessed/performed          Past Medical History:  Diagnosis Date   Cervical spondylosis without myelopathy    Chronic neck pain    Fecal incontinence    History of asthma    per pt has issues with [place] smoke, no issues since age 61's when smoking was ban inside   History of kidney stones    Idiopathic generalized epilepsy [facility]) neurologist---  dr georjean   new onset seizure april and august 2020;  dx 10/ 2020    (11-25-2019 pt stated last episode 01/ 19/ 2021 was noctural)   Mixed stress and urge urinary incontinence    Wears contact lenses    Past Surgical History:  Procedure Laterality Date   ANAL RECTAL MANOMETRY N/A 10/09/2019   Procedure: ANAL MANOMETRY;  Surgeon: Debby Hila, MD;  Location: [facility];  Service: Endoscopy;  Laterality: N/A;   DILATION AND CURETTAGE OF UTERUS     ELBOW SURGERY Right    INTERSTIM IMPLANT REMOVAL N/A 03/08/2024   Procedure: REMOVAL, NEUROSTIMULATOR, SACRAL;  Surgeon: Debby Hila, MD;  Location: [facility];  Service: General;  Laterality: N/A;  REMOVAL OF SACRAL NERVE STIMULATOR   NECK SURGERY     x  6 3 plates in neck   TOE SURGERY Left    x 4    Patient Active Problem List   Diagnosis Date Noted   Localization-related idiopathic epilepsy and epileptic syndromes with seizures of localized onset, not intractable, without  status epilepticus (HCC) 04/27/2020   Facet arthropathy, cervical 10/08/2018   S/P cervical spinal fusion 09/24/2018   Incontinence of feces 05/01/2018   Hyperlipidemia 05/01/2018   Internal hemorrhoids 10/11/2017   Neuropathic pain of chest 08/14/2017   Atrophy of vagina 06/26/2017   Mixed stress and urge urinary incontinence 06/26/2017   Osteopenia 06/26/2017   Postmenopausal bleeding 06/26/2017   Neck pain 06/26/2017    ONSET DATE: 06/10/2024 (MD referral)  REFERRING DIAG: R51.9 (ICD-10-CM) - Headache, unspecified   THERAPY DIAG:  Cervicalgia  Other symptoms and signs involving the musculoskeletal system  Rationale for Evaluation and Treatment: Rehabilitation  SUBJECTIVE:  SUBJECTIVE STATEMENT: Had a bad Sunday from holding head down while quilting Pt accompanied by: self  PERTINENT HISTORY: occipital neuralgia, chronic pain syndrome, post laminectomy syndrome  PAIN:  Are you having pain? Yes: NPRS scale: 5/10 (worst 8/10; best 3/10) Pain location: L suboccipital pain Pain description: aches, hurts, headaches Aggravating factors: stress Relieving factors: pain medications  PRECAUTIONS: Other: None per patient report  RED FLAGS: None   WEIGHT BEARING RESTRICTIONS: No  FALLS: Has patient fallen in last 6 months? 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  PATIENT GOALS: To help with the headaches to release the pain  OBJECTIVE:   TODAY'S TREATMENT: 07/17/24 Activity Comments  Seated ER+scap retraction 2x10, 3 sec hold Yellow band  Cervical traction with resistance (supine) 2x10, 3 sec hold   Manual therapy Soft tissue  mobilization to bilat cervical column to address trigger points/pain in scalenes, levator, upper trap and suboccipital release              TODAY'S TREATMENT: 07/12/24 Activity Comments  Soft tissue mobilization Suboccipital release Scalene release Gentle PIVM PA mobilizations for flexion/extension  Supine cervical extension isometric 2x10 3 sec hold  HEP review Cues for position and decreased force/ROM             PATIENT EDUCATION: Education details: Eval results, POC, HEP initiated Person educated: Patient Education method: Explanation, Demonstration, and Handouts Education comprehension: verbalized understanding, returned demonstration, and needs further education  HOME EXERCISE PROGRAM: Access Code: [email or web address] URL: [email or web address] Date: 06/26/2024 Prepared by: [facility] - Outpatient  Rehab - [facility]  Exercises - Supine Cervical Retraction with Towel  - 1 x daily - 7 x weekly - 1 sets - 5-10 reps - Neck Retraction  - 1-2 x daily - 7 x weekly - 1 sets - 5-10 reps - 10 sec hold - Supine Isometric Neck Extension  - 1 x daily - 7 x weekly - 3 sets - 10 reps - 3-5 sec hold - Shoulder External Rotation and Scapular Retraction with Resistance  - 2-3 x weekly - 3 sets - 10-20 reps - 2-3 sec hold - Cervical Retraction with Resistance  - 1 x daily - 2-3 x weekly - 1-3 sets - 10-20 reps  Note: Objective measures were completed at Evaluation unless otherwise noted.  DIAGNOSTIC FINDINGS: NA; MRI cervical spine and CT brain scheduled  COGNITION: Overall cognitive status: Within functional limits for tasks assessed   SENSATION: Light touch: WFL  COORDINATION: WFL  BUE strength:  equal and WFL bilaterally  POSTURE: tends to hold neck in slight lateral flex towards R  NECK ROM: limitations due to pain, previous hx of surgeries Flexion 14 degrees Extension 12 degrees R rotation 24 degrees L rotation 20 degrees R lateral flexion 10 L lateral  flexion 20  PALPATION: Pt tender to palpation with trigger points noted (pt very sensitive) along Bilat upper traps and L occiput/suboccipital area  GAIT: Findings: NT   PATIENT SURVEYS:  NDI:  NECK DISABILITY INDEX  Date: 06/26/2024 Score  Pain intensity 3 = The pain is fairly severe at the moment  2. Personal care (washing, dressing, etc.) 0 = I can look after myself normally without causing extra pain  3. Lifting 1 =  I can lift heavy weights but it gives extra pain  4. Reading (6) 1 = I can read as much as  I want to with slight pain in my neck  5. Headaches (4) 5 = I have headaches almost all the time  6. Concentration (9) 0 =  I can concentrate fully when I want to with no difficulty  7. Work 0 =  I can do as much work as I want to  8. Driving (10) 1 =  I can drive my car as long as I want with slight pain in my neck  9. Sleeping (8) 3 =  My sleep is moderately disturbed (2-3 hrs sleepless)  10. Recreation (5) 1 =  I am able to engage in all my recreation activities, with some pain in my neck  Total 15/50   Minimum Detectable Change (90% confidence): 5 points or 10% points                                                                                                                              TREATMENT DATE: 06/26/2024      GOALS: Goals reviewed with patient? Yes  SHORT TERM GOALS: Target date: 07/12/2024  Pt will be independent with HEP for improved pain, headaches, neck flexibility. Baseline: Goal status: INITIAL  LONG TERM GOALS: Target date: 08/09/2024  Pt will be independent with progression of HEP for improved pain, headaches, neck flexibility. Baseline:  Goal status: INITIAL  2.  Neck flexibility/ROM to improve by at least 5 degrees for flexion, extension, rotation for improved participation in ADLs. Baseline:  Goal status: INITIAL  3.  NDI score to improve by 5 points to demo decreased pain interfering with daily activities. Baseline: 15 Goal status:  INITIAL  4.  Pt will report 50% reduction in headaches limiting functional mobility and daily activities. Baseline:  Goal status: INITIAL   ASSESSMENT:  CLINICAL IMPRESSION: Instructed in postural endurance exercises to improve activity tolerance w/ cues for sequence and form. Soft tissue mobilization to improve ROM and reduce pain/soft tissue dysfunction with trigger points more prominent and tender to palpation left > right  OBJECTIVE IMPAIRMENTS: decreased ROM, impaired flexibility, postural dysfunction, and pain.   ACTIVITY LIMITATIONS: lifting and sleeping  PARTICIPATION LIMITATIONS: driving and community activity  PERSONAL FACTORS: 3+ comorbidities: see above PMH (multiple neck surgeries) are also affecting patient's functional outcome.   REHAB POTENTIAL: Good  CLINICAL DECISION MAKING: Stable/uncomplicated  EVALUATION COMPLEXITY: Low  PLAN:  PT FREQUENCY: 1-2x/week  PT DURATION: 6 weeks plus eval visit  PLANNED INTERVENTIONS: 97110-Therapeutic exercises, 97530- Therapeutic activity, [id number]- Neuromuscular re-education, 97535- Self Care, 02859- Manual therapy, G0283- Electrical stimulation (unattended), 20560 (1-2 muscles), 20561 (3+ muscles)- Dry Needling, Patient/Family education, and Taping  PLAN FOR NEXT SESSION: Review initial HEP and progress for gentle stretch and flexibility, gentle suboccipital release; STM and education on potential ways to lessen pain-TENS/dry needling?   Jonette MARLA Sandifer, PT 07/17/2024, 8:49 AM  [facility] at [facility] [address] [place], [address] [place], [place] [address] Phone # [phone] Fax # [phone]  [phone]       

## 2024-07-19 DIAGNOSIS — M9901 Segmental and somatic dysfunction of cervical region: Secondary | ICD-10-CM | POA: Diagnosis not present

## 2024-07-19 DIAGNOSIS — M47812 Spondylosis without myelopathy or radiculopathy, cervical region: Secondary | ICD-10-CM | POA: Diagnosis not present

## 2024-07-19 DIAGNOSIS — M961 Postlaminectomy syndrome, not elsewhere classified: Secondary | ICD-10-CM | POA: Diagnosis not present

## 2024-07-19 DIAGNOSIS — G894 Chronic pain syndrome: Secondary | ICD-10-CM | POA: Diagnosis not present

## 2024-07-22 NOTE — Therapy (Signed)
 OUTPATIENT PHYSICAL THERAPY NEURO TREATMENT   Patient Name: Carol [name] MRN: [id number] DOB:04-Sep-1946, 78 y.o., female Today's Date: 07/23/2024   PCP: Valentin Skates, DO  REFERRING PROVIDER: Valentin Skates, DO   END OF SESSION:  PT End of Session - 07/23/24 0930     Visit Number 4    Number of Visits 13    Date for Recertification  08/09/24    Authorization Type HTA    Progress Note Due on Visit 10    PT Start Time [phone]    PT Stop Time 0930    PT Time Calculation (min) 38 min    Activity Tolerance Patient tolerated treatment well    Behavior During Therapy [facility] for tasks assessed/performed           Past Medical History:  Diagnosis Date   Cervical spondylosis without myelopathy    Chronic neck pain    Fecal incontinence    History of asthma    per pt has issues with [place] smoke, no issues since age 43's when smoking was ban inside   History of kidney stones    Idiopathic generalized epilepsy [facility]) neurologist---  dr georjean   new onset seizure april and august 2020;  dx 10/ 2020    (11-25-2019 pt stated last episode 01/ 19/ 2021 was noctural)   Mixed stress and urge urinary incontinence    Wears contact lenses    Past Surgical History:  Procedure Laterality Date   ANAL RECTAL MANOMETRY N/A 10/09/2019   Procedure: ANAL MANOMETRY;  Surgeon: Debby Hila, MD;  Location: [facility];  Service: Endoscopy;  Laterality: N/A;   DILATION AND CURETTAGE OF UTERUS     ELBOW SURGERY Right    INTERSTIM IMPLANT REMOVAL N/A 03/08/2024   Procedure: REMOVAL, NEUROSTIMULATOR, SACRAL;  Surgeon: Debby Hila, MD;  Location: [facility];  Service: General;  Laterality: N/A;  REMOVAL OF SACRAL NERVE STIMULATOR   NECK SURGERY     x  6 3 plates in neck   TOE SURGERY Left    x 4    Patient Active Problem List   Diagnosis Date Noted   Localization-related idiopathic epilepsy and epileptic syndromes with seizures of localized onset, not intractable, without  status epilepticus (HCC) 04/27/2020   Facet arthropathy, cervical 10/08/2018   S/P cervical spinal fusion 09/24/2018   Incontinence of feces 05/01/2018   Hyperlipidemia 05/01/2018   Internal hemorrhoids 10/11/2017   Neuropathic pain of chest 08/14/2017   Atrophy of vagina 06/26/2017   Mixed stress and urge urinary incontinence 06/26/2017   Osteopenia 06/26/2017   Postmenopausal bleeding 06/26/2017   Neck pain 06/26/2017    ONSET DATE: 06/10/2024 (MD referral)  REFERRING DIAG: R51.9 (ICD-10-CM) - Headache, unspecified   THERAPY DIAG:  Cervicalgia  Other symptoms and signs involving the musculoskeletal system  Rationale for Evaluation and Treatment: Rehabilitation  SUBJECTIVE:  SUBJECTIVE STATEMENT: Had a lot of pain over the weekend and spent the whole day in bed Saturday d/t pain. Has MRI tomorrow.    Pt accompanied by: self  PERTINENT HISTORY: occipital neuralgia, chronic pain syndrome, post laminectomy syndrome  PAIN:  Are you having pain? Yes: NPRS scale: 5.5/10 Pain location: L suboccipital pain Pain description: aches, hurts, headaches Aggravating factors: stress Relieving factors: pain medications  PRECAUTIONS: Other: None per patient report  RED FLAGS: None   WEIGHT BEARING RESTRICTIONS: No  FALLS: Has patient fallen in last 6 months? 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  PATIENT GOALS: To help with the headaches to release the pain  OBJECTIVE:      TODAY'S TREATMENT: 07/23/24 Activity Comments  manual TPR and STM Focus on L>R suboccipitals and paraspinals; initially pt very TTP but improved  significantly  Reports improvement to 5/10 pain  grade I/II towel neck distraction in supine 5x10 For muscle stretch; good tolerance  supine cervical retractions 10x3 Instruction on proper positioning   DNF lift offs 10x5 Instruction on proper form and 1 inch lift   Trial and practice using theracane on back of L side of neck        HOME EXERCISE PROGRAM: Access Code: [email or web address] URL: [email or web address] Date: 07/23/2024 Prepared by: [facility] - Outpatient  Rehab - [facility]  Program Notes try a theracane for massage  Exercises - Supine Cervical Retraction with Towel  - 1 x daily - 7 x weekly - 1 sets - 5-10 reps - Neck Retraction  - 1-2 x daily - 7 x weekly - 1 sets - 5-10 reps - 10 sec hold - Supine Isometric Neck Extension  - 1 x daily - 7 x weekly - 3 sets - 10 reps - 3-5 sec hold - Shoulder External Rotation and Scapular Retraction with Resistance  - 2-3 x weekly - 3 sets - 10-20 reps - 2-3 sec hold - Cervical Retraction with Resistance  - 1 x daily - 2-3 x weekly - 1-3 sets - 10-20 reps - Supine DNF Liftoffs  - 1 x daily - 5 x weekly - 2 sets - 10 reps - 5 sec hold   PATIENT EDUCATION: Education details: HEP update, discussed pain management techniques  Person educated: Patient Education method: Explanation, Demonstration, Tactile cues, Verbal cues, and Handouts Education comprehension: verbalized understanding and returned demonstration    Note: Objective measures were completed at Evaluation unless otherwise noted.  DIAGNOSTIC FINDINGS: NA; MRI cervical spine and CT brain scheduled  COGNITION: Overall cognitive status: Within functional limits for tasks assessed   SENSATION: Light touch: WFL  COORDINATION: WFL  BUE strength:  equal and WFL bilaterally  POSTURE: tends to hold neck in slight lateral flex towards R  NECK ROM: limitations due to pain, previous hx of surgeries Flexion 14 degrees Extension 12 degrees R rotation 24  degrees L rotation 20 degrees R lateral flexion 10 L lateral flexion 20  PALPATION: Pt tender to palpation with trigger points noted (pt very sensitive) along Bilat upper traps and L occiput/suboccipital area  GAIT: Findings: NT   PATIENT SURVEYS:  NDI:  NECK DISABILITY INDEX  Date: 06/26/2024 Score  Pain intensity 3 = The pain is fairly severe at the moment  2. Personal care (washing, dressing, etc.) 0 = I can look after myself normally without causing extra pain  3. Lifting 1 =  I can lift heavy weights but it gives extra pain  4. Reading (6) 1 = I can read as much as I want to with slight pain in my neck  5. Headaches (4) 5 = I have headaches almost all the time  6. Concentration (9) 0 =  I can concentrate fully when I want to with no difficulty  7. Work 0 =  I can do as much work as I want to  8. Driving (10) 1 =  I can drive my car as long as I want with slight pain in my neck  9. Sleeping (8) 3 =  My sleep is moderately disturbed (2-3 hrs sleepless)  10. Recreation (5) 1 =  I am able to engage in all my recreation activities, with some pain in my neck  Total 15/50   Minimum Detectable Change (90% confidence): 5 points or 10% points                                                                                                                              TREATMENT DATE: 06/26/2024      GOALS: Goals reviewed with patient? Yes  SHORT TERM GOALS: Target date: 07/12/2024  Pt will be independent with HEP for improved pain, headaches, neck flexibility. Baseline: Goal status: MET   LONG TERM GOALS: Target date: 08/09/2024  Pt will be independent with progression of HEP for improved pain, headaches, neck flexibility. Baseline:  Goal status: INITIAL  2.  Neck flexibility/ROM to improve by at least 5 degrees for flexion, extension, rotation for improved participation in ADLs. Baseline:  Goal status: INITIAL  3.  NDI score to improve by 5 points to demo decreased pain  interfering with daily activities. Baseline: 15 Goal status: INITIAL  4.  Pt will report 50% reduction in headaches limiting functional mobility and daily activities. Baseline:  Goal status: INITIAL   ASSESSMENT:  CLINICAL IMPRESSION: Patient arrived to session with report of increased pain over the weekend. Worked on addressing pain and soft tissue restriction with MT, with improving TTP after STM. Patient tolerated DNF strengthening well and this was updated into HEP. Educated on self-massage tool to manage pain at home. Pt reported understanding and reported mild improvement in pain levels.  OBJECTIVE IMPAIRMENTS: decreased ROM, impaired flexibility, postural dysfunction, and pain.   ACTIVITY LIMITATIONS: lifting and sleeping  PARTICIPATION LIMITATIONS: driving and community activity  PERSONAL FACTORS: 3+ comorbidities: see above PMH (multiple neck surgeries) are also affecting patient's functional outcome.   REHAB POTENTIAL: Good  CLINICAL DECISION MAKING: Stable/uncomplicated  EVALUATION COMPLEXITY: Low  PLAN:  PT FREQUENCY: 1-2x/week  PT DURATION: 6 weeks plus eval visit  PLANNED INTERVENTIONS: 97110-Therapeutic exercises, 97530- Therapeutic activity, [id number]- Neuromuscular re-education, 97535- Self Care, 02859- Manual therapy, G0283- Electrical stimulation (unattended), 20560 (1-2 muscles), 20561 (3+ muscles)- Dry Needling, Patient/Family education, and Taping  PLAN FOR NEXT SESSION: Review initial HEP and progress for gentle stretch  and flexibility, gentle suboccipital release; STM and education on potential ways to lessen pain-TENS/dry needling?   Louana Terrilyn Christians, PT, DPT 07/23/24 9:33 AM  [facility] at [facility] [address] [place], [address] [place], [place] [address] Phone # [phone] Fax # [phone]

## 2024-07-23 ENCOUNTER — Ambulatory Visit: Admitting: Physical Therapy

## 2024-07-23 ENCOUNTER — Encounter: Payer: Self-pay | Admitting: Physical Therapy

## 2024-07-23 DIAGNOSIS — M542 Cervicalgia: Secondary | ICD-10-CM | POA: Diagnosis not present

## 2024-07-23 DIAGNOSIS — R29898 Other symptoms and signs involving the musculoskeletal system: Secondary | ICD-10-CM

## 2024-07-24 ENCOUNTER — Other Ambulatory Visit: Payer: Self-pay | Admitting: Internal Medicine

## 2024-07-24 ENCOUNTER — Inpatient Hospital Stay
Admission: RE | Admit: 2024-07-24 | Discharge: 2024-07-24 | Disposition: A | Source: Ambulatory Visit | Attending: Internal Medicine

## 2024-07-24 ENCOUNTER — Ambulatory Visit
Admission: RE | Admit: 2024-07-24 | Discharge: 2024-07-24 | Disposition: A | Source: Ambulatory Visit | Attending: Internal Medicine

## 2024-07-24 ENCOUNTER — Ambulatory Visit
Admission: RE | Admit: 2024-07-24 | Discharge: 2024-07-24 | Disposition: A | Discharge status: Home / Self Care | Source: Ambulatory Visit | Attending: Internal Medicine | Admitting: Internal Medicine

## 2024-07-24 DIAGNOSIS — M9901 Segmental and somatic dysfunction of cervical region: Secondary | ICD-10-CM | POA: Diagnosis not present

## 2024-07-24 DIAGNOSIS — R519 Headache, unspecified: Secondary | ICD-10-CM

## 2024-07-24 DIAGNOSIS — M47812 Spondylosis without myelopathy or radiculopathy, cervical region: Secondary | ICD-10-CM | POA: Diagnosis not present

## 2024-07-24 DIAGNOSIS — G894 Chronic pain syndrome: Secondary | ICD-10-CM | POA: Diagnosis not present

## 2024-07-24 DIAGNOSIS — M961 Postlaminectomy syndrome, not elsewhere classified: Secondary | ICD-10-CM | POA: Diagnosis not present

## 2024-07-24 DIAGNOSIS — M5481 Occipital neuralgia: Secondary | ICD-10-CM | POA: Diagnosis not present

## 2024-07-24 DIAGNOSIS — M4802 Spinal stenosis, cervical region: Secondary | ICD-10-CM | POA: Diagnosis not present

## 2024-07-24 DIAGNOSIS — R198 Other specified symptoms and signs involving the digestive system and abdomen: Secondary | ICD-10-CM | POA: Diagnosis not present

## 2024-07-24 MED ORDER — GADOPICLENOL 0.5 MMOL/ML IV SOLN
5.0000 mL | Freq: Once | INTRAVENOUS | Status: AC | PRN
  Administered 2024-07-24: 5 mL via INTRAVENOUS

## 2024-07-29 ENCOUNTER — Encounter: Payer: Self-pay | Admitting: Radiology

## 2024-07-30 NOTE — Therapy (Signed)
 OUTPATIENT PHYSICAL THERAPY NEURO TREATMENT   Patient Name: Carol [name] MRN: [id number] DOB:1946/03/15, 78 y.o., female Today's Date: 07/31/2024   PCP: Valentin Skates, DO  REFERRING PROVIDER: Valentin Skates, DO   END OF SESSION:  PT End of Session - 07/31/24 0930     Visit Number 5    Number of Visits 13    Date for Recertification  08/09/24    Authorization Type HTA    Progress Note Due on Visit 10    PT Start Time 0848    PT Stop Time 0928    PT Time Calculation (min) 40 min    Activity Tolerance Patient tolerated treatment well    Behavior During Therapy [facility] for tasks assessed/performed            Past Medical History:  Diagnosis Date   Cervical spondylosis without myelopathy    Chronic neck pain    Fecal incontinence    History of asthma    per pt has issues with [place] smoke, no issues since age 76's when smoking was ban inside   History of kidney stones    Idiopathic generalized epilepsy [facility]) neurologist---  dr georjean   new onset seizure april and august 2020;  dx 10/ 2020    (11-25-2019 pt stated last episode 01/ 19/ 2021 was noctural)   Mixed stress and urge urinary incontinence    Wears contact lenses    Past Surgical History:  Procedure Laterality Date   ANAL RECTAL MANOMETRY N/A 10/09/2019   Procedure: ANAL MANOMETRY;  Surgeon: Debby Hila, MD;  Location: [facility];  Service: Endoscopy;  Laterality: N/A;   DILATION AND CURETTAGE OF UTERUS     ELBOW SURGERY Right    INTERSTIM IMPLANT REMOVAL N/A 03/08/2024   Procedure: REMOVAL, NEUROSTIMULATOR, SACRAL;  Surgeon: Debby Hila, MD;  Location: [facility];  Service: General;  Laterality: N/A;  REMOVAL OF SACRAL NERVE STIMULATOR   NECK SURGERY     x  6 3 plates in neck   TOE SURGERY Left    x 4    Patient Active Problem List   Diagnosis Date Noted   Localization-related idiopathic epilepsy and epileptic syndromes with seizures of localized onset, not intractable,  without status epilepticus (HCC) 04/27/2020   Facet arthropathy, cervical 10/08/2018   S/P cervical spinal fusion 09/24/2018   Incontinence of feces 05/01/2018   Hyperlipidemia 05/01/2018   Internal hemorrhoids 10/11/2017   Neuropathic pain of chest 08/14/2017   Atrophy of vagina 06/26/2017   Mixed stress and urge urinary incontinence 06/26/2017   Osteopenia 06/26/2017   Postmenopausal bleeding 06/26/2017   Neck pain 06/26/2017    ONSET DATE: 06/10/2024 (MD referral)  REFERRING DIAG: R51.9 (ICD-10-CM) - Headache, unspecified   THERAPY DIAG:  Cervicalgia  Other symptoms and signs involving the musculoskeletal system  Rationale for Evaluation and Treatment: Rehabilitation  SUBJECTIVE:  SUBJECTIVE STATEMENT: Neck is feeling not good. Also reports HA. Reports pain fluctuates and not sure if massage helps. Been working on HEP some and there are some exercises that flare it up. Got her MRI results back which showed inflammation and arthritis.     Pt accompanied by: self  PERTINENT HISTORY: occipital neuralgia, chronic pain syndrome, post laminectomy syndrome  PAIN:  Are you having pain? Yes: NPRS scale: 5/10 Pain location: L suboccipital pain Pain description: aches, hurts, headaches Aggravating factors: stress Relieving factors: pain medications  PRECAUTIONS: Other: None per patient report  RED FLAGS: None   WEIGHT BEARING RESTRICTIONS: No  FALLS: Has patient fallen in last 6 months? 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  PATIENT GOALS: To help with the headaches to release the pain  OBJECTIVE:    TODAY'S TREATMENT:  07/31/24 Activity Comments  shoulder circles fwd/back 10x each  Limited ROM; advised to perform within tolerance   scapular retractions 10x3 Good form  red TB rows 2x10  Cueinfg for scap squeeze, avoiding shoulder extension past neutral   sitting B shoulder ER red TB 10x Good form  supine B shoulder horizontal ABD red TB 2x10 Good form   manual TPR and STM and IASTM to cervical musculature  Soft tissue restriction in L>R side; pt reported good improvement in pain levels after MT       HOME EXERCISE PROGRAM Last updated: 07/31/24 Access Code: [email or web address] URL: [email or web address] Date: 07/31/2024 Prepared by: [facility] - Outpatient  Rehab - [facility]  Program Notes try a theracane for massage  Exercises - Supine Cervical Retraction with Towel  - 1 x daily - 7 x weekly - 1 sets - 5-10 reps - Neck Retraction  - 1-2 x daily - 7 x weekly - 1 sets - 5-10 reps - 10 sec hold - Shoulder External Rotation and Scapular Retraction with Resistance  - 2-3 x weekly - 3 sets - 10-20 reps - 2-3 sec hold - Cervical Retraction with Resistance  - 1 x daily - 2-3 x weekly - 1-3 sets - 10-20 reps - Seated Shoulder Horizontal Abduction with Resistance  - 1 x daily - 5 x weekly - 2 sets - 10 reps   PATIENT EDUCATION: Education details: discussed supine extension isometric and DNF lifts and removed them as pt reports increased pain with these activities, HEP update  Person educated: Patient Education method: Explanation, Demonstration, Tactile cues, Verbal cues, and Handouts Education comprehension: verbalized understanding and returned demonstration    HOME EXERCISE PROGRAM: Access Code: [email or web address] URL: [email or web address] Date: 07/23/2024 Prepared by: [facility] - Outpatient  Rehab - [facility]  Program Notes try a theracane for massage  Exercises - Supine Cervical Retraction with Towel  - 1 x daily - 7 x weekly - 1 sets - 5-10 reps - Neck Retraction  -  1-2 x daily - 7 x weekly - 1 sets - 5-10 reps - 10 sec hold - Supine Isometric Neck Extension  - 1 x daily - 7 x weekly - 3 sets - 10 reps - 3-5 sec hold - Shoulder External Rotation and Scapular Retraction with Resistance  - 2-3 x weekly - 3 sets - 10-20 reps - 2-3 sec hold - Cervical Retraction with Resistance  - 1 x daily - 2-3 x weekly - 1-3 sets - 10-20 reps - Supine DNF Liftoffs  -  1 x daily - 5 x weekly - 2 sets - 10 reps - 5 sec hold   PATIENT EDUCATION: Education details: HEP update, discussed pain management techniques  Person educated: Patient Education method: Explanation, Demonstration, Tactile cues, Verbal cues, and Handouts Education comprehension: verbalized understanding and returned demonstration    Note: Objective measures were completed at Evaluation unless otherwise noted.  DIAGNOSTIC FINDINGS: NA; MRI cervical spine and CT brain scheduled  COGNITION: Overall cognitive status: Within functional limits for tasks assessed   SENSATION: Light touch: WFL  COORDINATION: WFL  BUE strength:  equal and WFL bilaterally  POSTURE: tends to hold neck in slight lateral flex towards R  NECK ROM: limitations due to pain, previous hx of surgeries Flexion 14 degrees Extension 12 degrees R rotation 24 degrees L rotation 20 degrees R lateral flexion 10 L lateral flexion 20  PALPATION: Pt tender to palpation with trigger points noted (pt very sensitive) along Bilat upper traps and L occiput/suboccipital area  GAIT: Findings: NT   PATIENT SURVEYS:  NDI:  NECK DISABILITY INDEX  Date: 06/26/2024 Score  Pain intensity 3 = The pain is fairly severe at the moment  2. Personal care (washing, dressing, etc.) 0 = I can look after myself normally without causing extra pain  3. Lifting 1 =  I can lift heavy weights but it gives extra pain  4. Reading (6) 1 = I can read as much as I want to with slight pain in my neck  5. Headaches (4) 5 = I have headaches almost all the time   6. Concentration (9) 0 =  I can concentrate fully when I want to with no difficulty  7. Work 0 =  I can do as much work as I want to  8. Driving (10) 1 =  I can drive my car as long as I want with slight pain in my neck  9. Sleeping (8) 3 =  My sleep is moderately disturbed (2-3 hrs sleepless)  10. Recreation (5) 1 =  I am able to engage in all my recreation activities, with some pain in my neck  Total 15/50   Minimum Detectable Change (90% confidence): 5 points or 10% points                                                                                                                              TREATMENT DATE: 06/26/2024      GOALS: Goals reviewed with patient? Yes  SHORT TERM GOALS: Target date: 07/12/2024  Pt will be independent with HEP for improved pain, headaches, neck flexibility. Baseline: Goal status: MET   LONG TERM GOALS: Target date: 08/09/2024  Pt will be independent with progression of HEP for improved pain, headaches, neck flexibility. Baseline:  Goal status: INITIAL  2.  Neck flexibility/ROM to improve by at least 5 degrees for flexion, extension, rotation for improved participation in ADLs. Baseline:  Goal status: INITIAL  3.  NDI  score to improve by 5 points to demo decreased pain interfering with daily activities. Baseline: 15 Goal status: INITIAL  4.  Pt will report 50% reduction in headaches limiting functional mobility and daily activities. Baseline:  Goal status: INITIAL   ASSESSMENT:  CLINICAL IMPRESSION: Patient arrived to session with report of continued neck pain and HA's. She reports aggravation of pain with certain HEP activities, thus these were removed. Session focused on periscapular strengthening and MT. patient performed activities with cueing for form and reported good improvement in pain levels from MT.   OBJECTIVE IMPAIRMENTS: decreased ROM, impaired flexibility, postural dysfunction, and pain.   ACTIVITY LIMITATIONS: lifting  and sleeping  PARTICIPATION LIMITATIONS: driving and community activity  PERSONAL FACTORS: 3+ comorbidities: see above PMH (multiple neck surgeries) are also affecting patient's functional outcome.   REHAB POTENTIAL: Good  CLINICAL DECISION MAKING: Stable/uncomplicated  EVALUATION COMPLEXITY: Low  PLAN:  PT FREQUENCY: 1-2x/week  PT DURATION: 6 weeks plus eval visit  PLANNED INTERVENTIONS: 97110-Therapeutic exercises, 97530- Therapeutic activity, [id number]- Neuromuscular re-education, 97535- Self Care, 02859- Manual therapy, G0283- Electrical stimulation (unattended), 20560 (1-2 muscles), 20561 (3+ muscles)- Dry Needling, Patient/Family education, and Taping  PLAN FOR NEXT SESSION: recert vs. DC; Review HEP and progress for gentle stretch and flexibility, gentle suboccipital release; STM and education on potential ways to lessen pain-TENS/dry needling?   Louana Terrilyn Christians, PT, DPT 07/31/24 9:31 AM  [facility] at [facility] [address] [place], [address] [place], [place] [address] Phone # [phone] Fax # [phone]

## 2024-07-31 ENCOUNTER — Encounter: Payer: Self-pay | Admitting: Physical Therapy

## 2024-07-31 ENCOUNTER — Ambulatory Visit: Attending: Internal Medicine | Admitting: Physical Therapy

## 2024-07-31 DIAGNOSIS — M542 Cervicalgia: Secondary | ICD-10-CM | POA: Diagnosis not present

## 2024-07-31 DIAGNOSIS — R29898 Other symptoms and signs involving the musculoskeletal system: Secondary | ICD-10-CM | POA: Insufficient documentation

## 2024-08-04 ENCOUNTER — Other Ambulatory Visit: Payer: Self-pay | Admitting: Neurology

## 2024-08-05 NOTE — Therapy (Signed)
 OUTPATIENT PHYSICAL THERAPY NEURO TREATMENT   Patient Name: Carol [name] MRN: [id number] DOB:1946/03/23, 78 y.o., female Today's Date: 08/05/2024   PCP: Valentin Skates, DO  REFERRING PROVIDER: Valentin Skates, DO   END OF SESSION:      Past Medical History:  Diagnosis Date   Cervical spondylosis without myelopathy    Chronic neck pain    Fecal incontinence    History of asthma    per pt has issues with [place] smoke, no issues since age 89's when smoking was ban inside   History of kidney stones    Idiopathic generalized epilepsy [facility]) neurologist---  dr georjean   new onset seizure april and august 2020;  dx 10/ 2020    (11-25-2019 pt stated last episode 01/ 19/ 2021 was noctural)   Mixed stress and urge urinary incontinence    Wears contact lenses    Past Surgical History:  Procedure Laterality Date   ANAL RECTAL MANOMETRY N/A 10/09/2019   Procedure: ANAL MANOMETRY;  Surgeon: Debby Hila, MD;  Location: [facility];  Service: Endoscopy;  Laterality: N/A;   DILATION AND CURETTAGE OF UTERUS     ELBOW SURGERY Right    INTERSTIM IMPLANT REMOVAL N/A 03/08/2024   Procedure: REMOVAL, NEUROSTIMULATOR, SACRAL;  Surgeon: Debby Hila, MD;  Location: [facility];  Service: General;  Laterality: N/A;  REMOVAL OF SACRAL NERVE STIMULATOR   NECK SURGERY     x  6 3 plates in neck   TOE SURGERY Left    x 4    Patient Active Problem List   Diagnosis Date Noted   Localization-related idiopathic epilepsy and epileptic syndromes with seizures of localized onset, not intractable, without status epilepticus (HCC) 04/27/2020   Facet arthropathy, cervical 10/08/2018   S/P cervical spinal fusion 09/24/2018   Incontinence of feces 05/01/2018   Hyperlipidemia 05/01/2018   Internal hemorrhoids 10/11/2017   Neuropathic pain of chest 08/14/2017   Atrophy of vagina 06/26/2017   Mixed stress and urge urinary incontinence 06/26/2017   Osteopenia 06/26/2017    Postmenopausal bleeding 06/26/2017   Neck pain 06/26/2017    ONSET DATE: 06/10/2024 (MD referral)  REFERRING DIAG: R51.9 (ICD-10-CM) - Headache, unspecified   THERAPY DIAG:  No diagnosis found.  Rationale for Evaluation and Treatment: Rehabilitation  SUBJECTIVE:                                                                                                                                                                                             SUBJECTIVE STATEMENT: Neck is feeling not good. Also reports HA. Reports pain fluctuates and not sure if massage helps. Been  working on HEP some and there are some exercises that flare it up. Got her MRI results back which showed inflammation and arthritis.     Pt accompanied by: self  PERTINENT HISTORY: occipital neuralgia, chronic pain syndrome, post laminectomy syndrome  PAIN:  Are you having pain? Yes: NPRS scale: 5/10 Pain location: L suboccipital pain Pain description: aches, hurts, headaches Aggravating factors: stress Relieving factors: pain medications  PRECAUTIONS: Other: None per patient report  RED FLAGS: None   WEIGHT BEARING RESTRICTIONS: No  FALLS: Has patient fallen in last 6 months? 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  PATIENT GOALS: To help with the headaches to release the pain  OBJECTIVE:    TODAY'S TREATMENT: 08/06/24 Activity Comments                       TODAY'S TREATMENT: 07/31/24 Activity Comments  shoulder circles fwd/back 10x each  Limited ROM; advised to perform within tolerance   scapular retractions 10x3 Good form  red TB rows 2x10  Cueinfg for scap squeeze, avoiding shoulder extension past neutral   sitting B shoulder ER red TB 10x Good form  supine B shoulder horizontal ABD red TB 2x10 Good form   manual TPR and  STM and IASTM to cervical musculature  Soft tissue restriction in L>R side; pt reported good improvement in pain levels after MT       HOME EXERCISE PROGRAM Last updated: 07/31/24 Access Code: [email or web address] URL: [email or web address] Date: 07/31/2024 Prepared by: [facility] - Outpatient  Rehab - [facility]  Program Notes try a theracane for massage  Exercises - Supine Cervical Retraction with Towel  - 1 x daily - 7 x weekly - 1 sets - 5-10 reps - Neck Retraction  - 1-2 x daily - 7 x weekly - 1 sets - 5-10 reps - 10 sec hold - Shoulder External Rotation and Scapular Retraction with Resistance  - 2-3 x weekly - 3 sets - 10-20 reps - 2-3 sec hold - Cervical Retraction with Resistance  - 1 x daily - 2-3 x weekly - 1-3 sets - 10-20 reps - Seated Shoulder Horizontal Abduction with Resistance  - 1 x daily - 5 x weekly - 2 sets - 10 reps   PATIENT EDUCATION: Education details: discussed supine extension isometric and DNF lifts and removed them as pt reports increased pain with these activities, HEP update  Person educated: Patient Education method: Explanation, Demonstration, Tactile cues, Verbal cues, and Handouts Education comprehension: verbalized understanding and returned demonstration    HOME EXERCISE PROGRAM: Access Code: [email or web address] URL: [email or web address] Date: 07/23/2024 Prepared by: [facility] - Outpatient  Rehab - [facility]  Program Notes try a theracane for massage  Exercises - Supine Cervical Retraction with Towel  - 1 x daily - 7 x weekly - 1 sets - 5-10 reps - Neck Retraction  - 1-2 x daily - 7 x weekly - 1 sets - 5-10 reps - 10 sec hold - Supine Isometric Neck Extension  - 1 x daily - 7 x weekly - 3 sets - 10 reps - 3-5 sec hold - Shoulder External Rotation and Scapular Retraction with Resistance  - 2-3 x weekly - 3 sets - 10-20 reps - 2-3 sec hold - Cervical Retraction with Resistance  - 1 x daily - 2-3 x weekly - 1-3 sets -  10-20  reps - Supine DNF Liftoffs  - 1 x daily - 5 x weekly - 2 sets - 10 reps - 5 sec hold   PATIENT EDUCATION: Education details: HEP update, discussed pain management techniques  Person educated: Patient Education method: Explanation, Demonstration, Tactile cues, Verbal cues, and Handouts Education comprehension: verbalized understanding and returned demonstration    Note: Objective measures were completed at Evaluation unless otherwise noted.  DIAGNOSTIC FINDINGS: NA; MRI cervical spine and CT brain scheduled  COGNITION: Overall cognitive status: Within functional limits for tasks assessed   SENSATION: Light touch: WFL  COORDINATION: WFL  BUE strength:  equal and WFL bilaterally  POSTURE: tends to hold neck in slight lateral flex towards R  NECK ROM: limitations due to pain, previous hx of surgeries Flexion 14 degrees Extension 12 degrees R rotation 24 degrees L rotation 20 degrees R lateral flexion 10 L lateral flexion 20  PALPATION: Pt tender to palpation with trigger points noted (pt very sensitive) along Bilat upper traps and L occiput/suboccipital area  GAIT: Findings: NT   PATIENT SURVEYS:  NDI:  NECK DISABILITY INDEX  Date: 06/26/2024 Score  Pain intensity 3 = The pain is fairly severe at the moment  2. Personal care (washing, dressing, etc.) 0 = I can look after myself normally without causing extra pain  3. Lifting 1 =  I can lift heavy weights but it gives extra pain  4. Reading (6) 1 = I can read as much as I want to with slight pain in my neck  5. Headaches (4) 5 = I have headaches almost all the time  6. Concentration (9) 0 =  I can concentrate fully when I want to with no difficulty  7. Work 0 =  I can do as much work as I want to  8. Driving (10) 1 =  I can drive my car as long as I want with slight pain in my neck  9. Sleeping (8) 3 =  My sleep is moderately disturbed (2-3 hrs sleepless)  10. Recreation (5) 1 =  I am able to engage in all my  recreation activities, with some pain in my neck  Total 15/50   Minimum Detectable Change (90% confidence): 5 points or 10% points                                                                                                                              TREATMENT DATE: 06/26/2024      GOALS: Goals reviewed with patient? Yes  SHORT TERM GOALS: Target date: 07/12/2024  Pt will be independent with HEP for improved pain, headaches, neck flexibility. Baseline: Goal status: MET   LONG TERM GOALS: Target date: 08/09/2024  Pt will be independent with progression of HEP for improved pain, headaches, neck flexibility. Baseline:  Goal status: INITIAL  2.  Neck flexibility/ROM to improve by at least 5 degrees for flexion, extension, rotation for improved participation in ADLs.  Baseline:  Goal status: INITIAL  3.  NDI score to improve by 5 points to demo decreased pain interfering with daily activities. Baseline: 15 Goal status: INITIAL  4.  Pt will report 50% reduction in headaches limiting functional mobility and daily activities. Baseline:  Goal status: INITIAL   ASSESSMENT:  CLINICAL IMPRESSION: Patient arrived to session with report of continued neck pain and HA's. She reports aggravation of pain with certain HEP activities, thus these were removed. Session focused on periscapular strengthening and MT. patient performed activities with cueing for form and reported good improvement in pain levels from MT.   OBJECTIVE IMPAIRMENTS: decreased ROM, impaired flexibility, postural dysfunction, and pain.   ACTIVITY LIMITATIONS: lifting and sleeping  PARTICIPATION LIMITATIONS: driving and community activity  PERSONAL FACTORS: 3+ comorbidities: see above PMH (multiple neck surgeries) are also affecting patient's functional outcome.   REHAB POTENTIAL: Good  CLINICAL DECISION MAKING: Stable/uncomplicated  EVALUATION COMPLEXITY: Low  PLAN:  PT FREQUENCY: 1-2x/week  PT  DURATION: 6 weeks plus eval visit  PLANNED INTERVENTIONS: 97110-Therapeutic exercises, 97530- Therapeutic activity, [id number]- Neuromuscular re-education, 97535- Self Care, [id number]- Manual therapy, [id number]- Electrical stimulation (unattended), 20560 (1-2 muscles), 20561 (3+ muscles)- Dry Needling, Patient/Family education, and Taping  PLAN FOR NEXT SESSION: recert vs. DC; Review HEP and progress for gentle stretch and flexibility, gentle suboccipital release; STM and education on potential ways to lessen pain-TENS/dry needling?   Louana Terrilyn Christians, PT, DPT 08/05/24 7:17 AM  [facility] at [facility] [address] [place], [address] [place], [place] [address] Phone # [phone] Fax # [phone]

## 2024-08-06 ENCOUNTER — Ambulatory Visit: Admitting: Physical Therapy

## 2024-08-06 ENCOUNTER — Encounter: Payer: Self-pay | Admitting: Physical Therapy

## 2024-08-06 DIAGNOSIS — M542 Cervicalgia: Secondary | ICD-10-CM

## 2024-08-06 DIAGNOSIS — R29898 Other symptoms and signs involving the musculoskeletal system: Secondary | ICD-10-CM

## 2024-08-07 ENCOUNTER — Encounter: Payer: Self-pay | Admitting: Neurology

## 2024-08-07 DIAGNOSIS — K5900 Constipation, unspecified: Secondary | ICD-10-CM | POA: Diagnosis not present

## 2024-08-07 DIAGNOSIS — K2930 Chronic superficial gastritis without bleeding: Secondary | ICD-10-CM | POA: Diagnosis not present

## 2024-08-07 DIAGNOSIS — D509 Iron deficiency anemia, unspecified: Secondary | ICD-10-CM | POA: Diagnosis not present

## 2024-11-11 ENCOUNTER — Ambulatory Visit: Admitting: Neurology

## 2024-11-18 ENCOUNTER — Ambulatory Visit: Admitting: Neurology
